# Patient Record
Sex: Female | Born: 1975 | Race: White | Hispanic: No | Marital: Single | Smoking: Current every day smoker
Health system: Southern US, Community
[De-identification: ages and names within clinical notes are randomized; demographics above are authoritative.]

## PROBLEM LIST (undated history)

## (undated) DIAGNOSIS — R87619 Unspecified abnormal cytological findings in specimens from cervix uteri: Secondary | ICD-10-CM

## (undated) DIAGNOSIS — O2230 Deep phlebothrombosis in pregnancy, unspecified trimester: Secondary | ICD-10-CM

## (undated) DIAGNOSIS — I2699 Other pulmonary embolism without acute cor pulmonale: Secondary | ICD-10-CM

## (undated) HISTORY — DX: Other pulmonary embolism without acute cor pulmonale: I26.99

## (undated) HISTORY — DX: Deep phlebothrombosis in pregnancy, unspecified trimester: O22.30

## (undated) HISTORY — PX: CHOLECYSTECTOMY: SHX55

## (undated) HISTORY — PX: KNEE SURGERY: SHX244

## (undated) HISTORY — PX: BUNIONECTOMY: SHX129

## (undated) HISTORY — DX: Unspecified abnormal cytological findings in specimens from cervix uteri: R87.619

## (undated) HISTORY — PX: NECK SURGERY: SHX720

---

## 2012-04-15 ENCOUNTER — Ambulatory Visit: Payer: Self-pay

## 2013-02-21 ENCOUNTER — Emergency Department: Payer: Self-pay | Admitting: Emergency Medicine

## 2013-02-21 DIAGNOSIS — S0990XA Unspecified injury of head, initial encounter: Secondary | ICD-10-CM | POA: Insufficient documentation

## 2013-02-21 DIAGNOSIS — S02401A Maxillary fracture, unspecified, initial encounter for closed fracture: Secondary | ICD-10-CM | POA: Insufficient documentation

## 2013-02-21 DIAGNOSIS — S0291XA Unspecified fracture of skull, initial encounter for closed fracture: Secondary | ICD-10-CM | POA: Insufficient documentation

## 2013-02-21 DIAGNOSIS — I2699 Other pulmonary embolism without acute cor pulmonale: Secondary | ICD-10-CM | POA: Insufficient documentation

## 2013-02-21 DIAGNOSIS — I609 Nontraumatic subarachnoid hemorrhage, unspecified: Secondary | ICD-10-CM | POA: Insufficient documentation

## 2013-02-21 DIAGNOSIS — Z8611 Personal history of tuberculosis: Secondary | ICD-10-CM | POA: Insufficient documentation

## 2013-02-21 LAB — ETHANOL: Ethanol %: 0.113 % — ABNORMAL HIGH (ref 0.000–0.080)

## 2013-02-21 LAB — CBC
HCT: 40 % (ref 35.0–47.0)
HGB: 13.2 g/dL (ref 12.0–16.0)
MCV: 89 fL (ref 80–100)
WBC: 19.4 10*3/uL — ABNORMAL HIGH (ref 3.6–11.0)

## 2013-02-21 LAB — COMPREHENSIVE METABOLIC PANEL
Albumin: 3.8 g/dL (ref 3.4–5.0)
Anion Gap: 7 (ref 7–16)
Chloride: 112 mmol/L — ABNORMAL HIGH (ref 98–107)
Creatinine: 0.71 mg/dL (ref 0.60–1.30)
EGFR (Non-African Amer.): >60
Osmolality: 279 (ref 275–301)
SGOT(AST): 26 U/L (ref 15–37)
Total Protein: 7.5 g/dL (ref 6.4–8.2)

## 2013-02-21 LAB — LIPASE, BLOOD: Lipase: 68 U/L — ABNORMAL LOW (ref 73–393)

## 2013-02-21 LAB — PROTIME-INR: INR: 1

## 2013-02-22 DIAGNOSIS — I82621 Acute embolism and thrombosis of deep veins of right upper extremity: Secondary | ICD-10-CM | POA: Insufficient documentation

## 2013-04-03 ENCOUNTER — Ambulatory Visit: Payer: Self-pay | Admitting: Hematology and Oncology

## 2013-07-14 ENCOUNTER — Emergency Department: Payer: Self-pay | Admitting: Emergency Medicine
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
## 2014-10-09 IMAGING — US US EXTREM UP VENOUS*R*
1 series · 13 of 24 positions shown · non-contrast
Comparison: None.

CLINICAL DATA: Right upper extremity swelling and pain, prior
history of DVT



[Series 1: us extrem up venous*right* · 0.06mm/px · 13 of 36 slices shown]
[im 1/36]
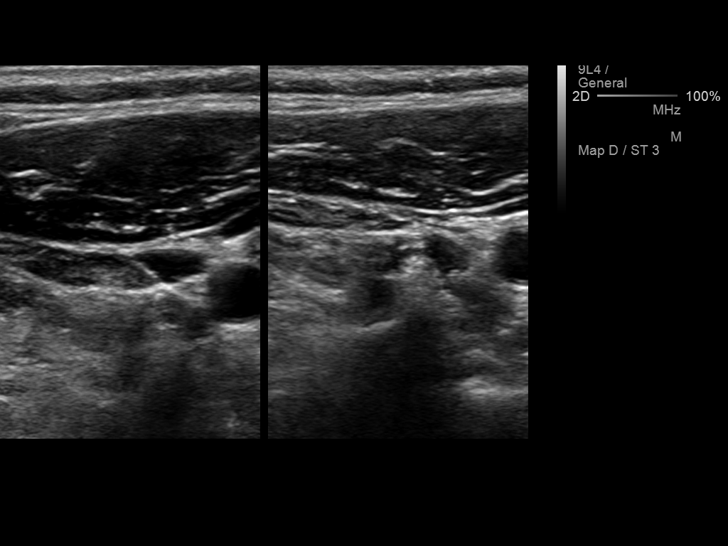
[im 4/36]
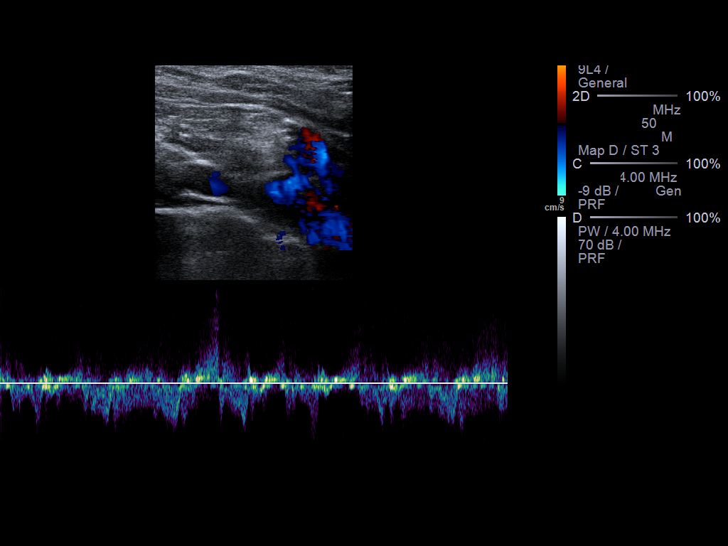
[im 7/36]
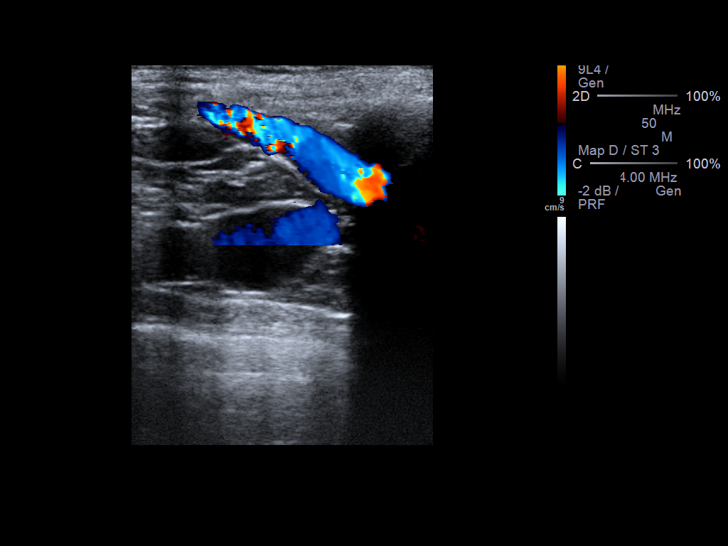
[im 10/36]
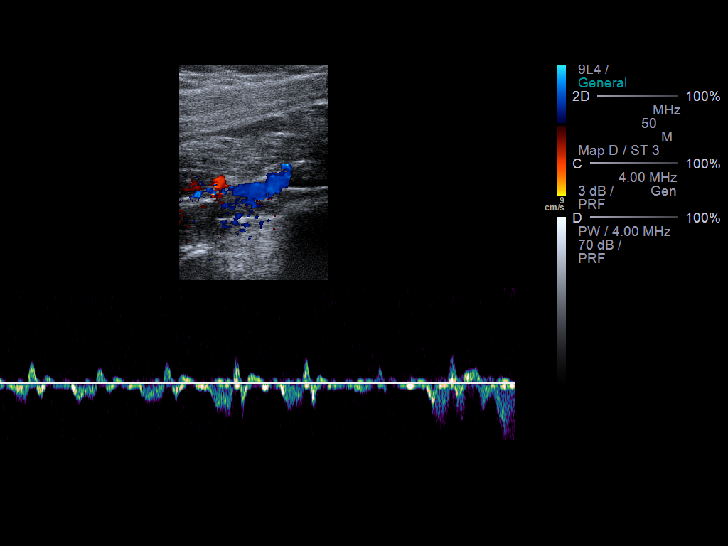
[im 13/36]
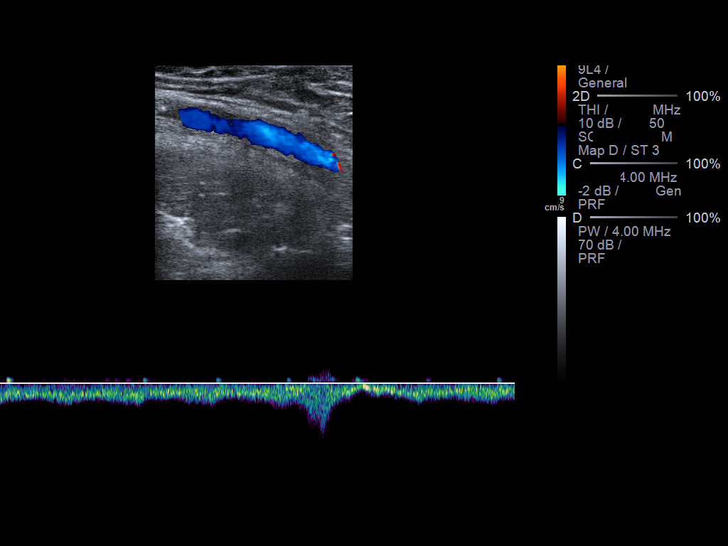
[im 16/36]
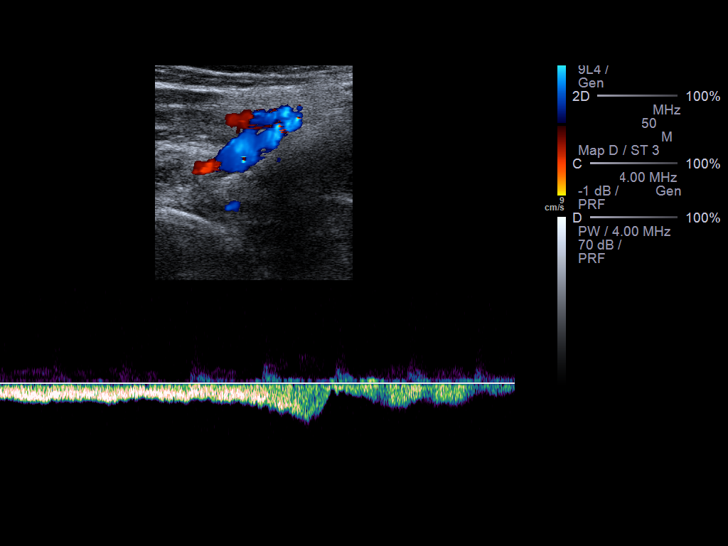
[im 19/36]
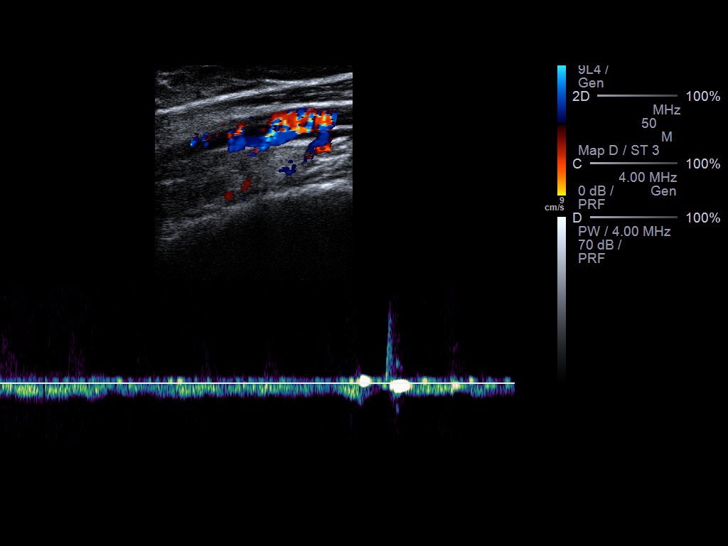
[im 20/36]
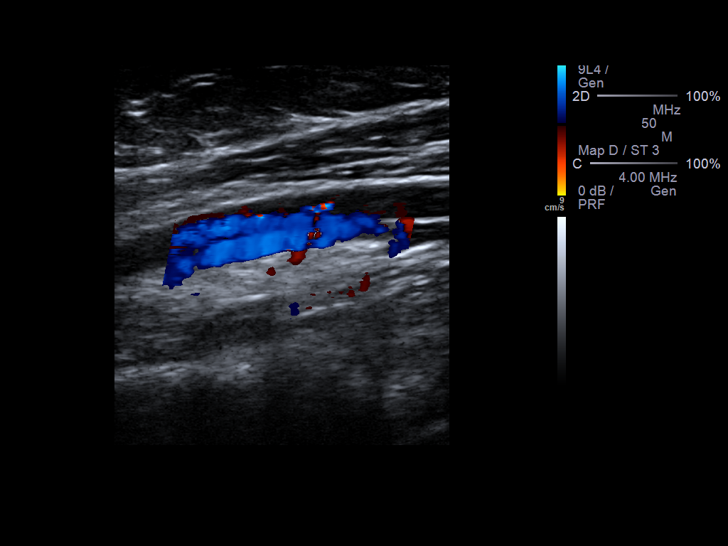
[im 23/36]
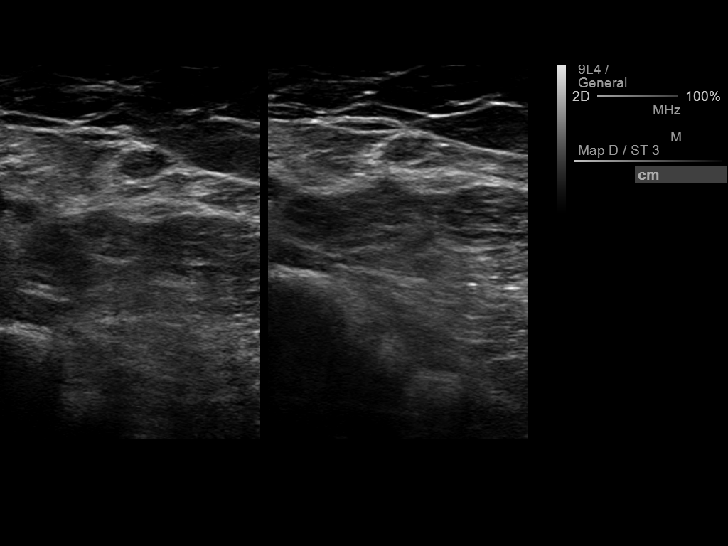
[im 26/36]
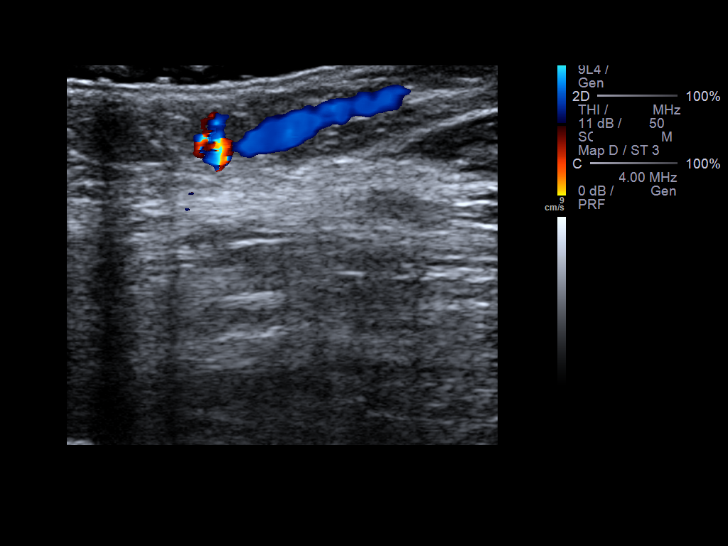
[im 29/36]
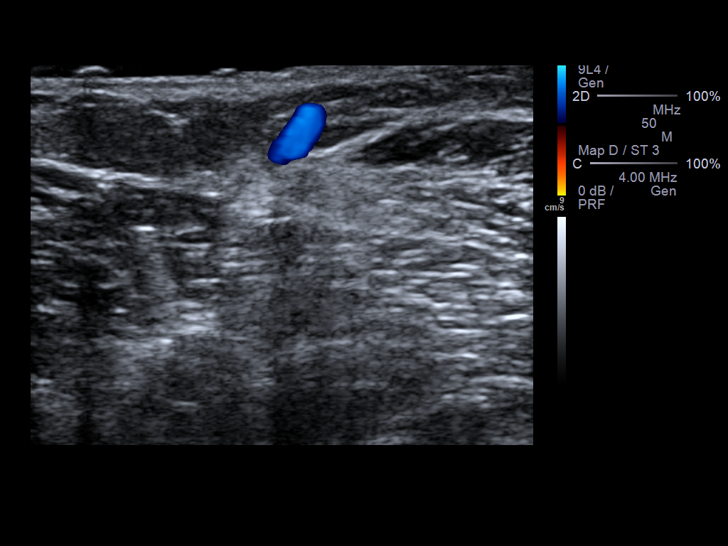
[im 32/36]
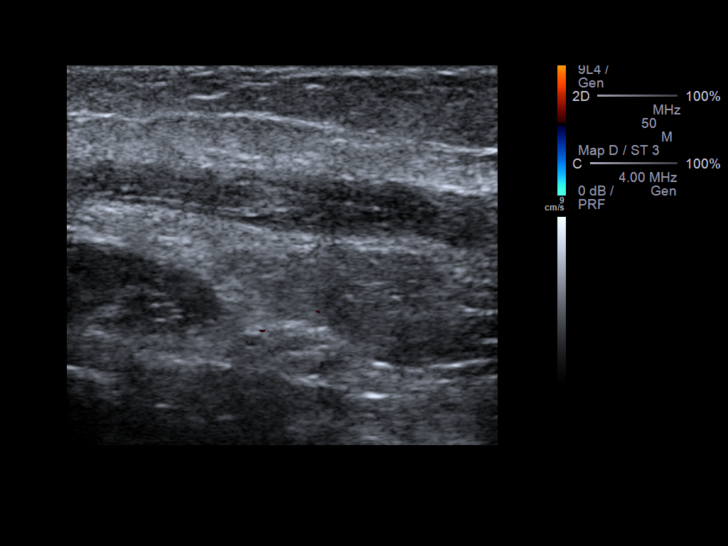
[im 36/36]
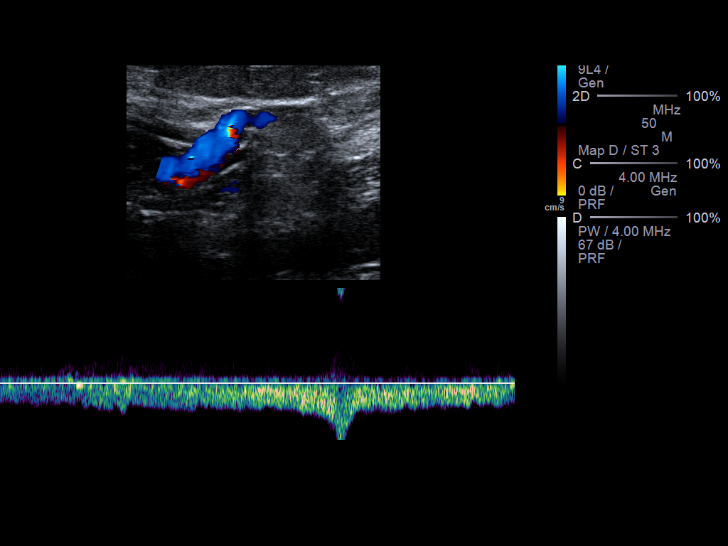

[13 of 24 positions shown; findings below may reference images not displayed]

FINDINGS: Internal Jugular Vein: No evidence of thrombus. Normal
compressibility, respiratory phasicity and response to augmentation.

Subclavian Vein: No evidence of thrombus. Normal compressibility,
respiratory phasicity and response to augmentation.

Axillary Vein: No evidence of thrombus. Normal compressibility,
respiratory phasicity and response to augmentation.

Cephalic Vein: No evidence of thrombus. Normal compressibility,
respiratory phasicity and response to augmentation.

Basilic Vein: Occlusive thrombus is identified in the right basilic
vein in the distal aspect of the upper extremity in the region of
the antecubital fossa. The vessel is noncompressible in this region.
The basilic vein is patent in the mid arm and more centrally.

Brachial Veins: No evidence of thrombus. Normal compressibility,
respiratory phasicity and response to augmentation.

Radial Veins: No evidence of thrombus. Normal compressibility,
respiratory phasicity and response to augmentation.

Ulnar Veins: No evidence of thrombus. Normal compressibility,
respiratory phasicity and response to augmentation.

Venous Reflux:  None visualized.

Other Findings:  None visualized.
IMPRESSION: Focal segment of acute occlusive DVT in the basilic vein in the
distal aspect of the upper arm in the region of the antecubital
fossa extending to just above the elbow.

These results were called by telephone at the time of interpretation
on 07/14/2013 at [DATE] to Dr. SIGFREDO TOURE , who verbally
acknowledged these results.

## 2016-08-07 ENCOUNTER — Encounter: Payer: Self-pay | Admitting: Emergency Medicine

## 2016-08-07 ENCOUNTER — Emergency Department: Payer: Self-pay

## 2016-08-07 ENCOUNTER — Emergency Department
Admission: EM | Admit: 2016-08-07 | Discharge: 2016-08-07 | Disposition: A | Discharge status: Home / Self Care | Payer: Self-pay | Attending: Emergency Medicine | Admitting: Emergency Medicine

## 2016-08-07 DIAGNOSIS — S81812A Laceration without foreign body, left lower leg, initial encounter: Secondary | ICD-10-CM | POA: Insufficient documentation

## 2016-08-07 DIAGNOSIS — Z23 Encounter for immunization: Secondary | ICD-10-CM | POA: Insufficient documentation

## 2016-08-07 DIAGNOSIS — W540XXA Bitten by dog, initial encounter: Secondary | ICD-10-CM | POA: Insufficient documentation

## 2016-08-07 DIAGNOSIS — Y999 Unspecified external cause status: Secondary | ICD-10-CM | POA: Insufficient documentation

## 2016-08-07 DIAGNOSIS — S81811A Laceration without foreign body, right lower leg, initial encounter: Secondary | ICD-10-CM | POA: Insufficient documentation

## 2016-08-07 DIAGNOSIS — Y92019 Unspecified place in single-family (private) house as the place of occurrence of the external cause: Secondary | ICD-10-CM | POA: Insufficient documentation

## 2016-08-07 DIAGNOSIS — Y939 Activity, unspecified: Secondary | ICD-10-CM | POA: Insufficient documentation

## 2016-08-07 DIAGNOSIS — F17210 Nicotine dependence, cigarettes, uncomplicated: Secondary | ICD-10-CM | POA: Insufficient documentation

## 2016-08-07 MED ORDER — OXYCODONE-ACETAMINOPHEN 5-325 MG PO TABS
1.0000 | ORAL_TABLET | Freq: Once | ORAL | Status: AC
  Administered 2016-08-07: 1 via ORAL
  Filled 2016-08-07: qty 1

## 2016-08-07 MED ORDER — AMOXICILLIN-POT CLAVULANATE 875-125 MG PO TABS: 1.0000 | ORAL_TABLET | Freq: Two times a day (BID) | ORAL | 0 refills | Status: AC

## 2016-08-07 MED ORDER — AMOXICILLIN-POT CLAVULANATE 875-125 MG PO TABS: 1.0000 | ORAL_TABLET | Freq: Two times a day (BID) | ORAL | 0 refills | Status: DC

## 2016-08-07 MED ORDER — TETANUS-DIPHTH-ACELL PERTUSSIS 5-2.5-18.5 LF-MCG/0.5 IM SUSP
0.5000 mL | Freq: Once | INTRAMUSCULAR | Status: AC
  Administered 2016-08-07: 0.5 mL via INTRAMUSCULAR
  Filled 2016-08-07: qty 0.5

## 2016-08-07 MED ORDER — OXYCODONE-ACETAMINOPHEN 5-325 MG PO TABS: 1.0000 | ORAL_TABLET | Freq: Four times a day (QID) | ORAL | 0 refills | Status: DC | PRN

## 2016-08-07 MED ORDER — LIDOCAINE HCL (PF) 1 % IJ SOLN
INTRAMUSCULAR | Status: AC
  Filled 2016-08-07: qty 10

## 2016-08-07 MED ORDER — OXYCODONE-ACETAMINOPHEN 5-325 MG PO TABS: 1.0000 | ORAL_TABLET | Freq: Four times a day (QID) | ORAL | 0 refills | Status: AC | PRN

## 2016-08-07 NOTE — ED Triage Notes (Addendum)
While at a potential client's home giving a quote on cleaning the house, patient was bitten by the dog on her leg.  [employer] speaking with patient in triage and patient refuses to give name and address of homeowner where bite occurred.  Patient does not know vaccination status of dog.    Dog is a pit bull.  Bite occurred last night at around 2300.  3 inch laceration to right lower leg.  Multiple puncture wounds to right lower leg.  Bleeding controlled.

## 2016-08-07 NOTE — ED Provider Notes (Signed)
[facility] Emergency Department Provider Note  ____________________________________________  Time seen: Approximately 8:18 AM  I have reviewed the triage vital signs and the nursing notes.   HISTORY  Chief Complaint Animal Bite    HPI Caitlin Wheeler is a 41 y.o. female that presents to the emergency department with dog bite. Patient was doing a quote for a house cleaning at a friends house 10 hours ago. Dog was on a chain but the chain was longer than the friend thought. Patient elevated leg and has been taking ibuprofen for pain. She did not realize how bad it was or she would have come in for evaluation last night. She has a large laceration on lower left leg and several puncture wounds and scratches to upper thigh. Last tetanus was in 2009. Dog is not up to date with shots. Patient denies fever, SOB, CP, nausea, vomiting, abdominal pain.     History reviewed. No pertinent past medical history.  There are no active problems to display for this patient.   History reviewed. No pertinent surgical history.  Prior to Admission medications   Medication Sig Start Date End Date Taking? Authorizing Provider  gabapentin (NEURONTIN) 600 MG tablet Take 1,800 mg by mouth 3 (three) times daily.   Yes [provider]  amoxicillin-clavulanate (AUGMENTIN) 875-125 MG tablet Take 1 tablet by mouth 2 (two) times daily. 08/07/16 08/17/16  Enid Derry, PA-C  oxyCODONE-acetaminophen (ROXICET) 5-325 MG tablet Take 1 tablet by mouth every 6 (six) hours as needed. 08/07/16 08/10/16  Enid Derry, PA-C    Allergies Patient has no known allergies.  No family history on file.  Social History Social History  Substance Use Topics  . Smoking status: Current Every Day Smoker    Types: E-cigarettes  . Smokeless tobacco: Never Used  . Alcohol use Yes     Review of Systems  Constitutional: No fever/chills Cardiovascular: No chest pain. Respiratory: No  SOB. Gastrointestinal: No abdominal pain.  No nausea, no vomiting.  Musculoskeletal: Positive for leg pain. Skin: Positive for laceration.  Neurological: Negative for headaches, numbness or tingling   ____________________________________________   PHYSICAL EXAM:  VITAL SIGNS: ED Triage Vitals [08/07/16 0726]  Enc Vitals Group     BP      Pulse      Resp      Temp      Temp src      SpO2      Weight 180 lb (81.6 kg)     Height 5\' 7"  (1.702 m)     Head Circumference      Peak Flow      Pain Score 10     Pain Loc      Pain Edu?      Excl. in GC?      Constitutional: Alert and oriented. Well appearing and in no acute distress. Eyes: Conjunctivae are normal. PERRL. EOMI. Head: Atraumatic. ENT:      Ears:      Nose: No congestion/rhinnorhea.      Mouth/Throat: Mucous membranes are moist.  Neck: No stridor.   Cardiovascular: Normal rate, regular rhythm.  Good peripheral circulation. Respiratory: Normal respiratory effort without tachypnea or retractions. Lungs CTAB. Good air entry to the bases with no decreased or absent breath sounds. Musculoskeletal: Full range of motion to all extremities. No gross deformities appreciated. Neurologic:  Normal speech and language. No gross focal neurologic deficits are appreciated.  Skin:  Skin is warm, dry. 2.5 in and 1 cm  lacerations to lower left shin. Multiple abrasions, punctures, and scratches to left thigh.   ____________________________________________   LABS (all labs ordered are listed, but only abnormal results are displayed)  Labs Reviewed - No data to display ____________________________________________  EKG   ____________________________________________  RADIOLOGY Lexine BatonI, Yared Susan, personally viewed and evaluated these images (plain radiographs) as part of my medical decision making, as well as reviewing the written report by the radiologist.  Dg Tibia/fibula Right  Result Date: 08/07/2016 CLINICAL DATA:  Dog  bite with a laceration. EXAM: RIGHT TIBIA AND FIBULA - 2 VIEW COMPARISON:  None. FINDINGS: The tibia and fibula are intact. No evidence for a fracture. Soft tissue lucency along the anterior mid and lower shin compatible with history of dog bite and laceration. No evidence for a radiopaque foreign body. IMPRESSION: No acute bone abnormality. Soft tissue changes compatible with a laceration. No evidence for a radiopaque foreign body. Electronically Signed   By: Richarda OverlieAdam  Henn M.D.   On: 08/07/2016 08:25    ____________________________________________    PROCEDURES  Procedure(s) performed:    Procedures  LACERATION REPAIR Performed by: Enid DerryAshley Lujean Ebright and Festus BarrenSelby PA-S  Consent: Verbal consent obtained.  Consent given by: patient  Prepped and Draped in normal sterile fashion  Wound explored: No foreign bodies   Laceration Location: right shin   Laceration Length: 2.5 inches  Anesthesia: None  Local anesthetic: lidocaine 1% without epinephrine  Anesthetic total: 8 ml  Irrigation method: syringe  Amount of cleaning: 500ml normal saline  Skin closure: 4-0 nylon  Number of sutures: 5  Technique: Horizontal mattress   Patient tolerance: Patient tolerated the procedure well with no immediate complications.  LACERATION REPAIR Performed by: Festus BarrenSelby PA-S  Consent: Verbal consent obtained.  Consent given by: patient  Prepped and Draped in normal sterile fashion  Wound explored: No foreign bodies   Laceration Location: left shin  Laceration Length: 1 cm  Anesthesia: None  Local anesthetic: lidocaine 1% without epinephrine  Anesthetic total: 3 ml  Irrigation method: syringe  Amount of cleaning: 500ml normal saline  Skin closure: 4-0 nylon  Number of sutures: 1  Technique: horizontal mattress   Patient tolerance: Patient tolerated the procedure well with no immediate complications.  Medications  Tdap (BOOSTRIX) injection 0.5 mL (0.5 mLs Intramuscular Given  08/07/16 0828)  oxyCODONE-acetaminophen (PERCOCET/ROXICET) 5-325 MG per tablet 1 tablet (1 tablet Oral Given 08/07/16 0828)     ____________________________________________   INITIAL IMPRESSION / ASSESSMENT AND PLAN / ED COURSE  Pertinent labs & imaging results that were available during my care of the patient were reviewed by me and considered in my medical decision making (see chart for details).  Review of the [place] CSRS was performed in accordance of the [facility] prior to dispensing any controlled drugs.   Patient's diagnosis is consistent with dog bite. Vital signs and exam are reassuring. Left tibia-fibula x-ray negative for radiopaque foreign object or acute bony abnormality. Wounds were irrigated with normal saline and iodine. Lacerations are gaping and under high tension. Horizontal mattress sutures were placed to better approximate wound. Laceration was loosely closed so the wound was not gaping but left loose to avoid infection. Tetanus shot was updated. Patient reported bite to police while in ED. Animal control was present and dog will be isolated for 10 days to be observed for rabies. Rabies vaccine will not be administered at this time and the patient will return to the ED if necessary. Patient will be discharged home with prescriptions for augmentin  and percocet. Patient is to follow up with PCP as directed. Patient is given ED precautions to return to the ED for any worsening or new symptoms.     ____________________________________________  FINAL CLINICAL IMPRESSION(S) / ED DIAGNOSES  Final diagnoses:  Dog bite, initial encounter      NEW MEDICATIONS STARTED DURING THIS VISIT:  Discharge Medication List as of 08/07/2016 10:38 AM          This chart was dictated using voice recognition software/Dragon. Despite best efforts to proofread, errors can occur which can change the meaning. Any change was purely unintentional.    Enid Derry, PA-C 08/07/16 1601     Emily Filbert, MD 08/08/16 [phone]

## 2016-08-07 NOTE — ED Notes (Signed)
Wound was cleaned with saline/betadine solution.

## 2016-08-07 NOTE — ED Notes (Signed)
See triage note  States she was bitten by a pit bull last pm around 10 pm    Laceration noted to right lower leg pt is very tearful  Family at bedside

## 2016-08-19 ENCOUNTER — Emergency Department
Admission: EM | Admit: 2016-08-19 | Discharge: 2016-08-19 | Disposition: A | Discharge status: Home / Self Care | Payer: Self-pay | Attending: Dermatology | Admitting: Dermatology

## 2016-08-19 ENCOUNTER — Encounter: Payer: Self-pay | Admitting: Emergency Medicine

## 2016-08-19 DIAGNOSIS — Z4802 Encounter for removal of sutures: Secondary | ICD-10-CM | POA: Insufficient documentation

## 2016-08-19 DIAGNOSIS — F17290 Nicotine dependence, other tobacco product, uncomplicated: Secondary | ICD-10-CM | POA: Insufficient documentation

## 2016-08-19 DIAGNOSIS — Z4800 Encounter for change or removal of nonsurgical wound dressing: Secondary | ICD-10-CM | POA: Insufficient documentation

## 2016-08-19 DIAGNOSIS — Z79899 Other long term (current) drug therapy: Secondary | ICD-10-CM | POA: Insufficient documentation

## 2016-08-19 DIAGNOSIS — Z5189 Encounter for other specified aftercare: Secondary | ICD-10-CM

## 2016-08-19 MED ORDER — CEPHALEXIN 500 MG PO CAPS: 500.0000 mg | ORAL_CAPSULE | Freq: Three times a day (TID) | ORAL | 0 refills | Status: AC

## 2016-08-19 NOTE — ED Notes (Signed)
Pt bit by a pitbull 10 days ago on left lower leg. Here for suture removal. Denies any pain currently.  Erythematous around wound bed.

## 2016-08-19 NOTE — ED Provider Notes (Signed)
[facility] Emergency Department Provider Note  ____________________________________________  Time seen: Approximately 3:33 PM  I have reviewed the triage vital signs and the nursing notes.   HISTORY  Chief Complaint Suture / Staple Removal  HPI Caitlin Wheeler is a 41 y.o. female who presents to the emergency department for suture removal and wound check. Sutures were inserted here on 08/08/2016 after she had a large laceration secondary to dog bite. Patient states that she completed the initial 10 days of antibiotics, but there has continued to be pain and erythema over the site. She denies fever.  History reviewed. No pertinent past medical history.  There are no active problems to display for this patient.   History reviewed. No pertinent surgical history.  Prior to Admission medications   Medication Sig Start Date End Date Taking? Authorizing Provider  cephALEXin (KEFLEX) 500 MG capsule Take 1 capsule (500 mg total) by mouth 3 (three) times daily. 08/19/16 08/26/16  Ilanna Deihl, Rulon Eisenmenger B, FNP  gabapentin (NEURONTIN) 600 MG tablet Take 1,800 mg by mouth 3 (three) times daily.    [provider]    Allergies Patient has no known allergies.  No family history on file.  Social History Social History  Substance Use Topics  . Smoking status: Current Every Day Smoker    Types: E-cigarettes  . Smokeless tobacco: Never Used  . Alcohol use Yes    Review of Systems Constitutional: No fever/chills Eyes: No visual changes. ENT: No sore throat. Cardiovascular: Denies chest pain. Respiratory: Denies shortness of breath. Gastrointestinal: No abdominal pain.  No nausea, no vomiting.  Musculoskeletal: Negative for back pain. Skin: Positive for wounds on the left lower extremity. Neurological: Negative for headaches, focal weakness or numbness. ____________________________________________   PHYSICAL EXAM:  VITAL SIGNS: ED Triage Vitals  Enc  Vitals Group     BP 08/19/16 1106 130/78     Pulse Rate 08/19/16 1106 89     Resp 08/19/16 1106 16     Temp 08/19/16 1106 98.4 F (36.9 C)     Temp Source 08/19/16 1106 Oral     SpO2 08/19/16 1106 97 %     Weight --      Height --      Head Circumference --      Peak Flow --      Pain Score 08/19/16 1116 0     Pain Loc --      Pain Edu? --      Excl. in GC? --     Constitutional: Alert and oriented. Well appearing and in no acute distress. Eyes: Conjunctivae are normal. EOMI. Nose: No congestion/rhinnorhea. Mouth/Throat: Mucous membranes are moist.  Cardiovascular:Good peripheral circulation. Respiratory: Normal respiratory effort.  No retractions.  Musculoskeletal: Full ROM throughout. Neurologic:  Normal speech and language. No gross focal neurologic deficits are appreciated. Speech is normal. No gait instability. Skin: Sutured lacerations on the left lower extremity with surrounding erythema. No drainage noted from the wound. Psychiatric: Mood and affect are normal. Speech and behavior are normal.  ____________________________________________   LABS (all labs ordered are listed, but only abnormal results are displayed)  Labs Reviewed - No data to display ____________________________________________  RADIOLOGY  Not indicated ____________________________________________   PROCEDURES  Procedure(s) performed: Sutures removed from LLE by RN  ___________________________________________   INITIAL IMPRESSION / ASSESSMENT AND PLAN / ED COURSE  41 year old female presenting to the ER for suture removal and evaluation of wound. Sutures removed today. She states she finished  the Augmentin as prescribed, but some concern remains for localized infection. She will be treated with Keflex and advised to follow up with the primary care provider in a couple of days for a wound check. She is to return to the ER for symptoms that change or worsen if unable to schedule an  appointment.  Pertinent labs & imaging results that were available during my care of the patient were reviewed by me and considered in my medical decision making (see chart for details).  Patient was advised to follow up with the primary care provider for symptoms of concern or return to the emergency department if unable to schedule an appointment. ____________________________________________   FINAL CLINICAL IMPRESSION(S) / ED DIAGNOSES  Final diagnoses:  Visit for suture removal  Visit for wound check    Note:  This document was prepared using Dragon voice recognition software and may include unintentional dictation errors.     Chinita Pesterriplett, Fronnie Urton B, FNP 08/23/16 [phone]    Jeanmarie PlantMcShane, James A, MD 08/24/16 [phone]

## 2016-08-19 NOTE — Discharge Instructions (Signed)
Follow up with your primary care provider if redness isn't going away over the next 2-3 days or sooner if it gets worse. Return to the ER for symptoms that change or worsen if unable to schedule an appointment.

## 2016-08-19 NOTE — ED Triage Notes (Signed)
Pt seen 10 days ago for dog bite, needs sutures removed.

## 2016-09-06 ENCOUNTER — Encounter: Payer: Self-pay | Admitting: Advanced Practice Midwife

## 2016-09-07 ENCOUNTER — Encounter: Payer: Self-pay | Admitting: Advanced Practice Midwife

## 2016-09-07 ENCOUNTER — Ambulatory Visit (INDEPENDENT_AMBULATORY_CARE_PROVIDER_SITE_OTHER): Payer: Self-pay | Admitting: Advanced Practice Midwife

## 2016-09-07 VITALS — BP 112/70 | HR 100 | Ht 67.0 in | Wt 192.0 lb

## 2016-09-07 DIAGNOSIS — S199XXA Unspecified injury of neck, initial encounter: Secondary | ICD-10-CM | POA: Insufficient documentation

## 2016-09-07 DIAGNOSIS — Z30011 Encounter for initial prescription of contraceptive pills: Secondary | ICD-10-CM

## 2016-09-07 DIAGNOSIS — F1010 Alcohol abuse, uncomplicated: Secondary | ICD-10-CM | POA: Insufficient documentation

## 2016-09-07 DIAGNOSIS — Z3046 Encounter for surveillance of implantable subdermal contraceptive: Secondary | ICD-10-CM

## 2016-09-07 DIAGNOSIS — L409 Psoriasis, unspecified: Secondary | ICD-10-CM | POA: Insufficient documentation

## 2016-09-07 DIAGNOSIS — F1490 Cocaine use, unspecified, uncomplicated: Secondary | ICD-10-CM | POA: Insufficient documentation

## 2016-09-07 MED ORDER — NORETHINDRONE 0.35 MG PO TABS: 1.0000 | ORAL_TABLET | Freq: Every day | ORAL | 11 refills | Status: DC

## 2016-09-07 NOTE — Progress Notes (Signed)
     GYNECOLOGY PROCEDURE NOTE Patient is here for removal of Nexplanon which was inserted 3 years ago. She has complaints of heavy, prolonged and irregular bleeding during the 3 years. Patient is requesting the pill for contraception. She uses e cigarettes and occasional cigarettes. She is advised to use progesterone only contraception due to increased risk from blood clots, stroke while using tobacco.   Nexplanon removal discussed in detail.  Risks of infection, bleeding, nerve injury all reviewed.  Patient understands risks and desires to proceed.  Verbal consent obtained.  Patient is certain she wants the Nexplanon removed.  All questions answered.   Procedure: Patient placed in dorsal supine with left arm above head, elbow flexed at 90 degrees, arm resting on examination table.  Nexplanon identified without problems.  Betadine scrub x3.  1 ml of 1% lidocaine injected under Nexplanon device without problems.  Sterile gloves applied.  Small 0.5cm incision made at distal tip of Nexplanon device with 11 blade scalpel.  Nexplanon brought to incision and grasped with a small kelly clamp.  Nexplanon removed intact without problems.  Pressure applied to incision.  Hemostasis obtained.  Steri-strips applied, followed by bandage and compression dressing.  Patient tolerated procedure well.  No complications.   Assessment: 41 y.o. year old female now s/p uncomplicated Nexplanon removal.  Plan: 1.  Patient given post procedure precautions and asked to call for fever, chills, redness or drainage from her incision, bleeding from incision.  She understands she will likely have a small bruise near site of removal and can remove bandage tomorrow and steri-strips in approximately 1 week.  2) Contraception: progesterone only pill  3) Schedule annual exam   Caitlin Wheeler, CNM

## 2016-09-10 ENCOUNTER — Encounter: Payer: Self-pay | Admitting: Emergency Medicine

## 2016-09-10 ENCOUNTER — Emergency Department
Admission: EM | Admit: 2016-09-10 | Discharge: 2016-09-10 | Disposition: A | Discharge status: Home / Self Care | Payer: Self-pay | Attending: Student in an Organized Health Care Education/Training Program | Admitting: Student in an Organized Health Care Education/Training Program

## 2016-09-10 DIAGNOSIS — T814XXD Infection following a procedure, subsequent encounter: Secondary | ICD-10-CM | POA: Insufficient documentation

## 2016-09-10 DIAGNOSIS — Z5189 Encounter for other specified aftercare: Secondary | ICD-10-CM

## 2016-09-10 DIAGNOSIS — Y69 Unspecified misadventure during surgical and medical care: Secondary | ICD-10-CM | POA: Insufficient documentation

## 2016-09-10 DIAGNOSIS — F17290 Nicotine dependence, other tobacco product, uncomplicated: Secondary | ICD-10-CM | POA: Insufficient documentation

## 2016-09-10 DIAGNOSIS — W540XXD Bitten by dog, subsequent encounter: Secondary | ICD-10-CM | POA: Insufficient documentation

## 2016-09-10 DIAGNOSIS — Z79899 Other long term (current) drug therapy: Secondary | ICD-10-CM | POA: Insufficient documentation

## 2016-09-10 DIAGNOSIS — L089 Local infection of the skin and subcutaneous tissue, unspecified: Secondary | ICD-10-CM

## 2016-09-10 DIAGNOSIS — Z9049 Acquired absence of other specified parts of digestive tract: Secondary | ICD-10-CM | POA: Insufficient documentation

## 2016-09-10 DIAGNOSIS — S81832D Puncture wound without foreign body, left lower leg, subsequent encounter: Secondary | ICD-10-CM | POA: Insufficient documentation

## 2016-09-10 DIAGNOSIS — T148XXA Other injury of unspecified body region, initial encounter: Secondary | ICD-10-CM

## 2016-09-10 MED ORDER — CLINDAMYCIN HCL 300 MG PO CAPS: 300.0000 mg | ORAL_CAPSULE | Freq: Four times a day (QID) | ORAL | 0 refills | Status: AC

## 2016-09-10 MED ORDER — CLINDAMYCIN PHOSPHATE 600 MG/50ML IV SOLN
600.0000 mg | Freq: Once | INTRAVENOUS | Status: AC
  Administered 2016-09-10: 600 mg via INTRAVENOUS
  Filled 2016-09-10: qty 50

## 2016-09-10 MED ORDER — SODIUM CHLORIDE 0.9 % IV BOLUS (SEPSIS)
1000.0000 mL | Freq: Once | INTRAVENOUS | Status: AC
  Administered 2016-09-10: 1000 mL via INTRAVENOUS

## 2016-09-10 NOTE — ED Notes (Signed)
Pt here for wound check post may 20th dog bite.  Pt states took abx as prescribed.  Reports took off bandage Friday and states area red, and swollen.

## 2016-09-10 NOTE — ED Provider Notes (Signed)
[facility] Emergency Department Provider Note   ____________________________________________   I have reviewed the triage vital signs and the nursing notes.   HISTORY  Chief Complaint Wound Check    HPI Caitlin Wheeler is a 41 y.o. female presents with left anterior lower leg wound that has not resolved since sustaining a dog bite on May 20. The wound initially was sutured and patient received Augmentin which she completed that course of antibiotic. When she returned for suture removal, the wound appeared erythematous and irritated with likely infection. Patient was prescribed a course of cephalexin. Patient completed this course of antibiotics and noted improvement of the wound overall. However, over the last week patient noted the wound where the dog's tooth had punctured began to drain with purulent drainage, the periwound area became erythematous and the entire lower leg became swollen. The patient decided to return to the emergency department for wound check.   Patient denies fever, chills, headache, vision changes, chest pain, chest tightness, shortness of breath, abdominal pain, nausea and vomiting.  Past Medical History:  Diagnosis Date  . Abnormal Pap smear of cervix   . DVT (deep vein thrombosis) in pregnancy ([place])   . Pulmonary embolism [facility])     Patient Active Problem List   Diagnosis Date Noted  . Alcohol abuse 09/07/2016  . Crack cocaine use 09/07/2016  . Neck injury 09/07/2016  . Psoriasis 09/07/2016  . Deep venous thrombosis of right upper extremity ([place]) 02/22/2013  . Closed skull fracture ([place]) 02/21/2013  . Head injury, acute 02/21/2013  . 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 Maxillary sinus fracture ([place]) 02/21/2013  . Pulmonary embolism ([place]) 02/21/2013  . Subarachnoid hemorrhage ([place]) 02/21/2013    Past Surgical History:  Procedure Laterality Date  . CHOLECYSTECTOMY    . KNEE SURGERY    . NECK SURGERY       Prior to Admission medications   Medication Sig Start Date End Date Taking? Authorizing Provider  clindamycin (CLEOCIN) 300 MG capsule Take 1 capsule (300 mg total) by mouth 4 (four) times daily. 09/10/16 09/20/16  Kellis Mcadam M, PA-C  Diclofenac Sodium (DICLO GEL) 1 % KIT Place onto the skin.    [provider]  gabapentin (NEURONTIN) 300 MG capsule Take 900 mg by mouth. 03/12/13   [provider]  gabapentin (NEURONTIN) 600 MG tablet Take 1,800 mg by mouth 3 (three) times daily.    [provider]  norethindrone (MICRONOR,CAMILA,ERRIN) 0.35 MG tablet Take 1 tablet (0.35 mg total) by mouth daily. 09/07/16   Rod Can, CNM    Allergies Latex  Family History  Problem Relation Age of Onset  . Hypertension Mother     Social History Social History  Substance Use Topics  . Smoking status: Current Every Day Smoker    Types: E-cigarettes  . Smokeless tobacco: Never Used  . Alcohol use Yes    Review of Systems Constitutional: Negative for fever/chills Eyes: No visual changes. ENT:  Negative for sore throat and for difficulty swallowing Cardiovascular: Denies chest pain. Respiratory: Denies cough Denies shortness of breath. Gastrointestinal: No abdominal pain.  No nausea, vomiting, diarrhea. Musculoskeletal: Negative for back pain. Negative for generalized body aches. Skin: Negative for rash. Erythema, induration and pain along left anterior lower leg approximately where prior dog bite occurred. Neurological: Negative for headaches. Able to ambulate. ____________________________________________   PHYSICAL EXAM:  VITAL SIGNS: ED Triage Vitals  Enc Vitals Group     BP 09/10/16 1145 (!) 132/92  Pulse Rate 09/10/16 1145 (!) 105     Resp 09/10/16 1145 18     Temp 09/10/16 1145 98.1 F (36.7 C)     Temp Source 09/10/16 1145 Oral     SpO2 09/10/16 1145 98 %     Weight 09/10/16 1146 192 lb (87.1 kg)     Height 09/10/16 1146 5' 7"  (1.702 m)      Head Circumference --      Peak Flow --      Pain Score 09/10/16 1145 3     Pain Loc --      Pain Edu? --      Excl. in [place]? --     Constitutional: Alert and oriented. Well appearing and in no acute distress.  Head: Normocephalic and atraumatic. Eyes: Conjunctivae are normal. PERRL.  Cardiovascular: Normal rate, regular rhythm. Normal distal pulses. Respiratory: Normal respiratory effort. Musculoskeletal: Nontender with normal range of motion in all extremities. Neurologic: Normal speech and language. No gross focal neurologic deficits are appreciated. No gait instability. Skin:  Skin is warm, dry and intact. No rash noted. Prior sutured wound healing adequately. Puncture wound covered with scabbing and minimal purulent drainage. Periwound is indurated, erythematous and very tender to palpation. Indurated area is approximately 6.5 cm in diameter with erythema approximately 8 cm in diameter. No fluctuant noted. Psychiatric: Mood and affect are normal.  ____________________________________________   LABS (all labs ordered are listed, but only abnormal results are displayed)  Labs Reviewed - No data to display ____________________________________________  EKG None ____________________________________________  RADIOLOGY None ____________________________________________   PROCEDURES  Procedure(s) performed:  Point-of-care ultrasound unremarkable for abscess or fluctuant.    Critical Care performed: no ____________________________________________   INITIAL IMPRESSION / ASSESSMENT AND PLAN / ED COURSE  Pertinent labs & imaging results that were available during my care of the patient were reviewed by me and considered in my medical decision making (see chart for details).  Patient presents with left anterior lower leg wound she originally sustained from a dog bite in May. Patient completed 2 courses of antibiotics, Augmentin and cephalexin. Patient returned today for a  wound recheck secondary to development of infection symptoms to include erythema, induration and purulent drainage from the puncture wound. Patient has been afebrile through the healing period of original injury and she is afebrile today. Point-of-care ultrasound was unremarkable for an abscess at this time. Patient received IV dose of clindamycin 600 mg during the course of care in emergency department today and she will be prescribed clindamycin to continue once discharged. Advised patient to keep the wound area covered during her work day and when out in the community. Also advised her to continue monitoring for worsening signs of infection and instruct her return to the emergency department if wound area did not improve. Patient informed of clinical course, understand medical decision-making process, and agree with plan.  Patient was advised to follow up with PCP as needed and was also advised to return to the emergency department for symptoms that change or worsen.      ____________________________________________   FINAL CLINICAL IMPRESSION(S) / ED DIAGNOSES  Final diagnoses:  Visit for wound check  Wound infection       NEW MEDICATIONS STARTED DURING THIS VISIT:  Discharge Medication List as of 09/10/2016  3:43 PM    START taking these medications   Details  clindamycin (CLEOCIN) 300 MG capsule Take 1 capsule (300 mg total) by mouth 4 (four) times daily., Starting Mon 09/10/2016, Until Thu  09/20/2016, Print         Note:  This document was prepared using Dragon voice recognition software and may include unintentional dictation errors.    Jerolyn Shin, PA-C 09/10/16 1656    Merlyn Lot, MD 09/11/16 [phone]

## 2016-09-10 NOTE — ED Triage Notes (Signed)
Pt reports has been seen here for wound to lower left leg and treated with antibiotics. Pt reports has been cleaning site with hydrogen peroxide and wound has been bubbling. Pt reports hitting the wound while at work the other day and the redness has increased and the area feels tight. Pt ambulatory to triage. No drainage noted from wound. Pt afebrile.

## 2017-11-02 IMAGING — DX DG TIBIA/FIBULA 2V*R*
2 series · 2 of 2 positions shown · non-contrast
Comparison: None.

CLINICAL DATA: Dog bite with a laceration.

EXAM:
RIGHT TIBIA AND FIBULA - 2 VIEW

[tibia ap]
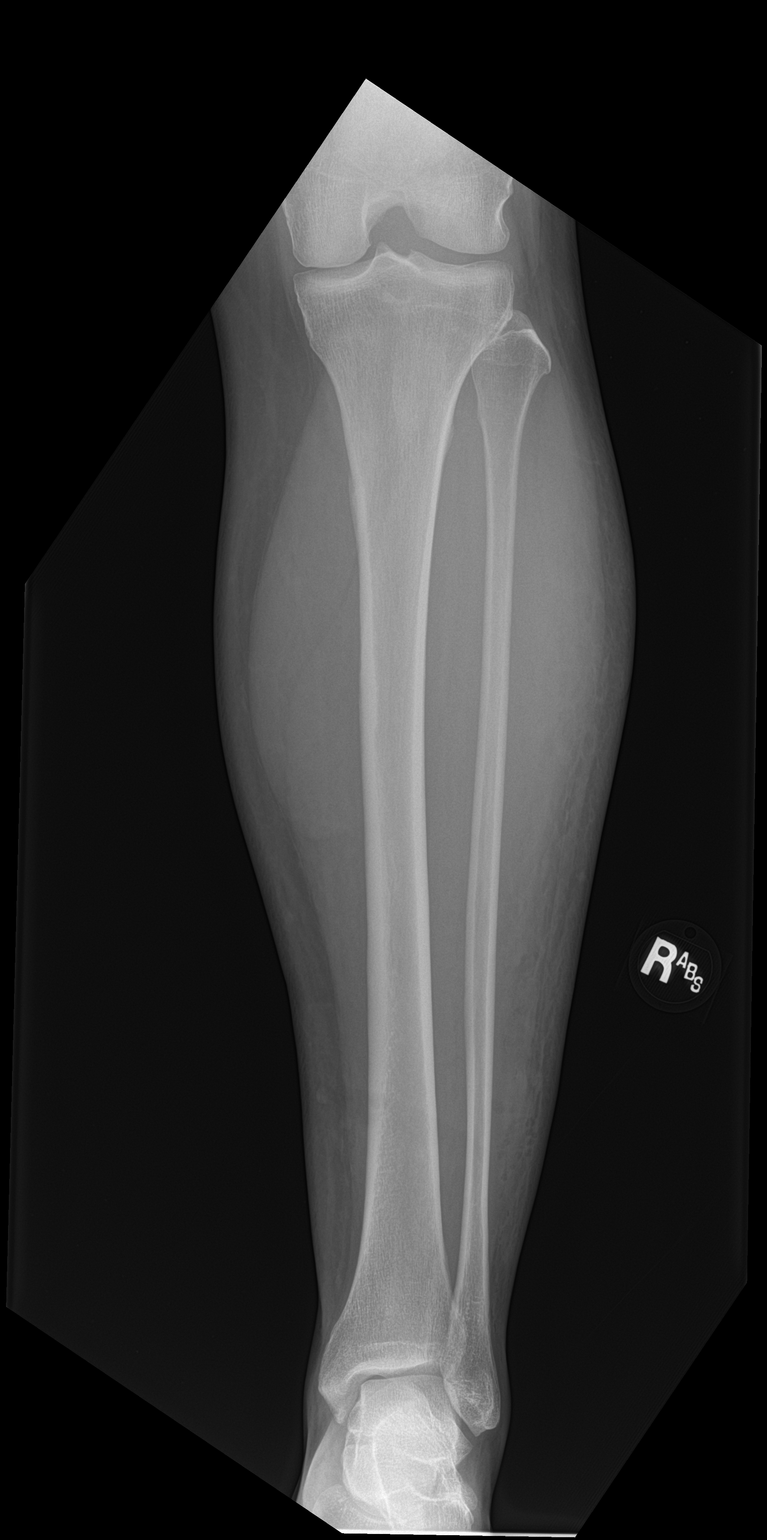

[tibia lat]
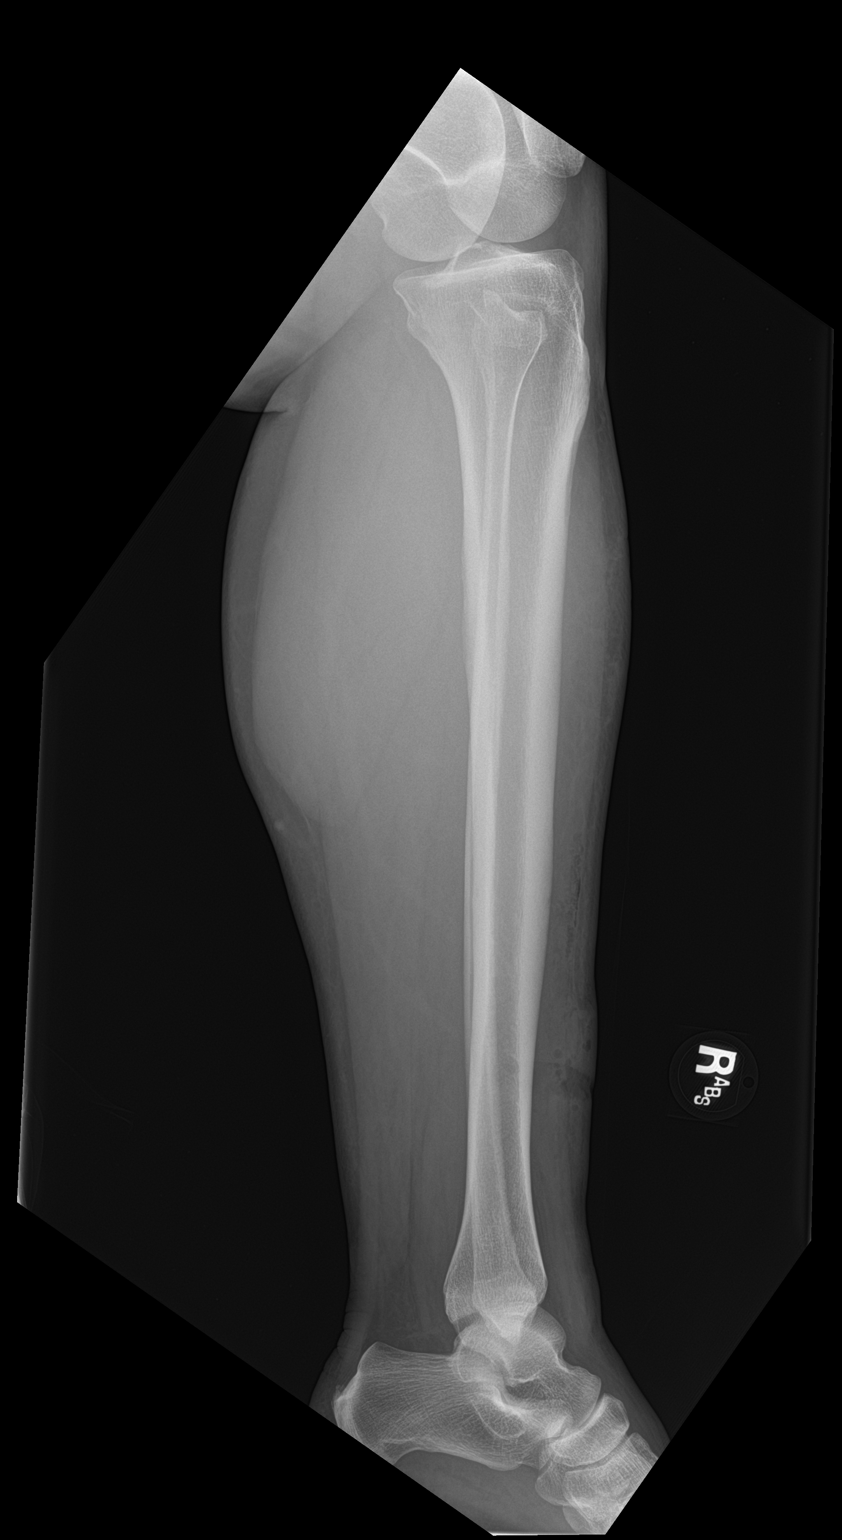

[2 of 2 positions shown; findings below may reference images not displayed]

FINDINGS: The tibia and fibula are intact. No evidence for a fracture. Soft
tissue lucency along the anterior mid and lower shin compatible with
history of dog bite and laceration. No evidence for a radiopaque
foreign body.
IMPRESSION: No acute bone abnormality.

Soft tissue changes compatible with a laceration. No evidence for a
radiopaque foreign body.

## 2017-12-19 ENCOUNTER — Other Ambulatory Visit: Payer: Self-pay | Admitting: Advanced Practice Midwife

## 2017-12-19 DIAGNOSIS — Z30011 Encounter for initial prescription of contraceptive pills: Secondary | ICD-10-CM

## 2018-01-16 DIAGNOSIS — E663 Overweight: Secondary | ICD-10-CM | POA: Insufficient documentation

## 2018-01-16 DIAGNOSIS — F17200 Nicotine dependence, unspecified, uncomplicated: Secondary | ICD-10-CM | POA: Insufficient documentation

## 2018-01-16 LAB — HM PAP SMEAR: HM Pap smear: NEGATIVE

## 2018-01-16 LAB — HM HIV SCREENING LAB: HM HIV Screening: NEGATIVE

## 2018-07-23 DIAGNOSIS — F17200 Nicotine dependence, unspecified, uncomplicated: Secondary | ICD-10-CM

## 2018-07-23 DIAGNOSIS — E663 Overweight: Secondary | ICD-10-CM

## 2019-08-05 ENCOUNTER — Ambulatory Visit: Payer: Self-pay

## 2020-09-15 ENCOUNTER — Ambulatory Visit: Payer: Self-pay

## 2020-09-21 ENCOUNTER — Encounter: Payer: Self-pay | Admitting: Family Medicine

## 2020-09-21 ENCOUNTER — Other Ambulatory Visit: Payer: Self-pay

## 2020-09-21 ENCOUNTER — Ambulatory Visit: Payer: Self-pay | Admitting: Family Medicine

## 2020-09-21 DIAGNOSIS — F1911 Other psychoactive substance abuse, in remission: Secondary | ICD-10-CM

## 2020-09-21 DIAGNOSIS — Z113 Encounter for screening for infections with a predominantly sexual mode of transmission: Secondary | ICD-10-CM

## 2020-09-21 DIAGNOSIS — Z91410 Personal history of adult physical and sexual abuse: Secondary | ICD-10-CM

## 2020-09-21 LAB — WET PREP FOR TRICH, YEAST, CLUE
Trichomonas Exam: NEGATIVE
Yeast Exam: NEGATIVE

## 2020-09-21 NOTE — Progress Notes (Signed)
Wet mount reviewed by provider, no tx per provider order. Pt accepted [employer] brochure. Provider orders completed.
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
## 2020-09-28 LAB — GONOCOCCUS CULTURE

## 2020-10-18 ENCOUNTER — Ambulatory Visit (LOCAL_COMMUNITY_HEALTH_CENTER): Payer: Self-pay

## 2020-10-18 DIAGNOSIS — Z111 Encounter for screening for respiratory tuberculosis: Secondary | ICD-10-CM

## 2020-10-18 NOTE — Progress Notes (Signed)
No charge PPD. 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  Needs CXR for admission into a treatment facility.  Informed patient we will place PPD and if positive on Friday, [facility] will proceed with ordering CXR.  Reports taking INH x 9 months. TB RN completed EPI at PPD placement appt.  Will only need a weight, CXR and offer HIV/RPR at PPDR appt if positive.  Patient referred to TB RN by [facility].  Patient also states that she needs an appt to assess nerve damage in her neck, back and arm.  Reports tingling/numbenss in fingers on right hand.  Informed that [facility] doesn't do Primary Care.  Will need an appt at [facility] or be seen at an Urgent care. Richmond Campbell, RN

## 2020-10-21 ENCOUNTER — Other Ambulatory Visit: Payer: Self-pay

## 2021-06-09 ENCOUNTER — Ambulatory Visit (LOCAL_COMMUNITY_HEALTH_CENTER): Payer: Self-pay | Admitting: Nurse Practitioner

## 2021-06-09 ENCOUNTER — Encounter: Payer: Self-pay | Admitting: Nurse Practitioner

## 2021-06-09 ENCOUNTER — Other Ambulatory Visit: Payer: Self-pay

## 2021-06-09 ENCOUNTER — Ambulatory Visit: Payer: Self-pay | Admitting: Nurse Practitioner

## 2021-06-09 VITALS — BP 132/88 | Ht 67.0 in | Wt 195.6 lb

## 2021-06-09 DIAGNOSIS — Z3009 Encounter for other general counseling and advice on contraception: Secondary | ICD-10-CM

## 2021-06-09 DIAGNOSIS — Z3041 Encounter for surveillance of contraceptive pills: Secondary | ICD-10-CM

## 2021-06-09 DIAGNOSIS — B379 Candidiasis, unspecified: Secondary | ICD-10-CM

## 2021-06-09 DIAGNOSIS — Z113 Encounter for screening for infections with a predominantly sexual mode of transmission: Secondary | ICD-10-CM

## 2021-06-09 LAB — WET PREP FOR TRICH, YEAST, CLUE: Trichomonas Exam: NEGATIVE

## 2021-06-09 MED ORDER — NORETHINDRONE 0.35 MG PO TABS: 1.0000 | ORAL_TABLET | Freq: Every day | ORAL | 0 refills | Status: DC

## 2021-06-09 MED ORDER — CLOTRIMAZOLE 1 % VA CREA: 1.0000 | TOPICAL_CREAM | Freq: Every day | VAGINAL | 0 refills | Status: AC

## 2021-06-09 NOTE — Progress Notes (Signed)
? ?[place] problem visit  ?[place] Department ? ?Subjective:  ?Caitlin Wheeler is a 46 y.o. being seen today for STD screening and a quick start for birth control pills.  ? ?Chief Complaint  ?Patient presents with  ? Contraception  ? ? ?HPI ? ? ?Does the patient have a current or past history of drug use? Yes, see STD note. ? No components found for: HCV] ? ? ?Health Maintenance Due  ?Topic Date Due  ? COVID-19 Vaccine (1) Never done  ? Hepatitis C Screening  Never done  ? INFLUENZA VACCINE  Never done  ? COLONOSCOPY (Pts 45-37yrs Insurance coverage will need to be confirmed)  Never done  ? PAP SMEAR-Modifier  01/16/2021  ? ? ?Review of Systems  ?Constitutional:  Negative for chills, fever, malaise/fatigue and weight loss.  ?HENT:  Negative for congestion, hearing loss and sore throat.   ?Eyes:  Negative for blurred vision, double vision and photophobia.  ?Respiratory:  Negative for shortness of breath.   ?Cardiovascular:  Negative for chest pain.  ?Gastrointestinal:  Negative for abdominal pain, blood in stool, constipation, diarrhea, heartburn, nausea and vomiting.  ?Genitourinary:  Negative for dysuria and frequency.  ?     Vaginal discharge, see STD flowsheet.   ?Musculoskeletal:  Negative for back pain, joint pain and neck pain.  ?Skin:  Negative for itching and rash.  ?Neurological:  Negative for dizziness, weakness and headaches.  ?Endo/Heme/Allergies:  Does not bruise/bleed easily.  ?Psychiatric/Behavioral:  Negative for depression, substance abuse and suicidal ideas.   ? ?The following portions of the patient's history were reviewed and updated as appropriate: allergies, current medications, past family history, past medical history, past social history, past surgical history and problem list. Problem list updated. ? ? ?See flowsheet for other program required questions. ? ?Objective:  ? ?Vitals:  ? 06/09/21 1341  ?BP: 132/88  ?Weight: 195 lb 9.6 oz (88.7 kg)  ?Height: 5'  7" (1.702 m)  ? ? ?Physical Exam ?Constitutional:   ?   Appearance: Normal appearance.  ?HENT:  ?   Head: Normocephalic.  ?   Right Ear: External ear normal.  ?   Left Ear: External ear normal.  ?   Nose: Nose normal.  ?   Mouth/Throat:  ?   Mouth: Mucous membranes are moist.  ?Pulmonary:  ?   Effort: Pulmonary effort is normal.  ?Abdominal:  ?   General: Abdomen is flat.  ?   Palpations: Abdomen is soft.  ?Genitourinary: ?   Comments: Deferred, patient desires to self collect.  ?Musculoskeletal:  ?   Cervical back: Full passive range of motion without pain, normal range of motion and neck supple.  ?Skin: ?   General: Skin is warm and dry.  ?Neurological:  ?   Mental Status: She is alert and oriented to person, place, and time.  ?Psychiatric:     ?   Attention and Perception: Attention normal.     ?   Mood and Affect: Mood normal.     ?   Speech: Speech normal.     ?   Behavior: Behavior is cooperative.  ? ? ? ? ?Assessment and Plan:  ?[name] is a 46 y.o. female presenting to the [facility] Department for a Women's Health problem visit ? ?1. Family planning ?-46 year old in clinic today for STD screening and desire birth control.  Due to history of smoking, will start patient today on 1 pack of  Micronor.  Patient to return to clinic for additional packs of pills and physical.  Appointment scheduled for 06/19/21.   ? ?- norethindrone (MICRONOR) 0.35 MG tablet; Take 1 tablet (0.35 mg total) by mouth daily.  Dispense: 28 tablet; Refill: 0 ? ? ? ? ?Return in about 10 days (around 06/19/2021). ? ?Future Appointments  ?Date Time Provider [place]  ?06/19/2021  9:40 AM AC-FP PROVIDER AC-FAM None  ? ? ?Gregary Cromer, FNP ? ?

## 2021-06-09 NOTE — Progress Notes (Signed)
BCM consent signed, Caitlin Wheeler and quitline cards given. Patient given 1 pac OC and scheduled to return for PE on 06/19/21. Wet mount reviewed and patient treated for yeast per provider orders.Burt Knack, RN  ?

## 2021-06-09 NOTE — Progress Notes (Signed)
Patient here for STI testing. Would like to start OC today. Planning to get married next week.Burt Knack, RN  ?
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
## 2021-06-09 NOTE — Progress Notes (Signed)
Patient was here for STD testing and wanted to talk about OC. Given 1 pack OC and scheduled for return PE on 06/19/21. Consent signed. See other document from today. Repeat BP was 122/79, provider aware.Burt Knack, RN  ?

## 2021-06-09 NOTE — Progress Notes (Signed)
Wet mount reviewed, patient treated for yeast per provider orders.Marland KitchenMarland KitchenBurt Knack, RN  ?

## 2021-06-14 LAB — GONOCOCCUS CULTURE

## 2021-06-19 ENCOUNTER — Ambulatory Visit: Payer: Self-pay

## 2021-11-15 ENCOUNTER — Emergency Department
Admission: EM | Admit: 2021-11-15 | Discharge: 2021-11-15 | Disposition: A | Discharge status: Home / Self Care | Payer: Self-pay | Attending: Emergency Medicine | Admitting: Emergency Medicine

## 2021-11-15 ENCOUNTER — Other Ambulatory Visit: Payer: Self-pay

## 2021-11-15 ENCOUNTER — Emergency Department: Payer: Self-pay

## 2021-11-15 DIAGNOSIS — Z7901 Long term (current) use of anticoagulants: Secondary | ICD-10-CM | POA: Insufficient documentation

## 2021-11-15 DIAGNOSIS — J189 Pneumonia, unspecified organism: Secondary | ICD-10-CM

## 2021-11-15 DIAGNOSIS — T40601A Poisoning by unspecified narcotics, accidental (unintentional), initial encounter: Secondary | ICD-10-CM

## 2021-11-15 DIAGNOSIS — J181 Lobar pneumonia, unspecified organism: Secondary | ICD-10-CM | POA: Insufficient documentation

## 2021-11-15 DIAGNOSIS — R000 Tachycardia, unspecified: Secondary | ICD-10-CM | POA: Insufficient documentation

## 2021-11-15 LAB — URINE DRUG SCREEN, QUALITATIVE (ARMC ONLY)
Amphetamines, Ur Screen: NOT DETECTED
Barbiturates, Ur Screen: NOT DETECTED
Benzodiazepine, Ur Scrn: NOT DETECTED
Cannabinoid 50 Ng, Ur ~~LOC~~: NOT DETECTED
Cocaine Metabolite,Ur ~~LOC~~: POSITIVE — AB
MDMA (Ecstasy)Ur Screen: NOT DETECTED
Methadone Scn, Ur: NOT DETECTED
Opiate, Ur Screen: POSITIVE — AB
Phencyclidine (PCP) Ur S: NOT DETECTED
Tricyclic, Ur Screen: NOT DETECTED

## 2021-11-15 LAB — URINALYSIS, ROUTINE W REFLEX MICROSCOPIC
Bilirubin Urine: NEGATIVE
Glucose, UA: NEGATIVE mg/dL
Ketones, ur: 5 mg/dL — AB
Leukocytes,Ua: NEGATIVE
Nitrite: NEGATIVE
Protein, ur: 30 mg/dL — AB
Specific Gravity, Urine: 1.042 — ABNORMAL HIGH (ref 1.005–1.030)
pH: 5 (ref 5.0–8.0)

## 2021-11-15 LAB — CBC WITH DIFFERENTIAL/PLATELET
Abs Immature Granulocytes: 0.57 10*3/uL — ABNORMAL HIGH (ref 0.00–0.07)
Basophils Absolute: 0.1 10*3/uL (ref 0.0–0.1)
Basophils Relative: 1 %
Eosinophils Absolute: 0 10*3/uL (ref 0.0–0.5)
Eosinophils Relative: 0 %
HCT: 42.7 % (ref 36.0–46.0)
Hemoglobin: 13.4 g/dL (ref 12.0–15.0)
Immature Granulocytes: 2 %
Lymphocytes Relative: 5 %
Lymphs Abs: 1.3 10*3/uL (ref 0.7–4.0)
MCH: 30.8 pg (ref 26.0–34.0)
MCHC: 31.4 g/dL (ref 30.0–36.0)
MCV: 98.2 fL (ref 80.0–100.0)
Monocytes Absolute: 0.6 10*3/uL (ref 0.1–1.0)
Monocytes Relative: 2 %
Neutro Abs: 21.5 10*3/uL — ABNORMAL HIGH (ref 1.7–7.7)
Neutrophils Relative %: 90 %
Platelets: 362 10*3/uL (ref 150–400)
RBC: 4.35 MIL/uL (ref 3.87–5.11)
RDW: 15.9 % — ABNORMAL HIGH (ref 11.5–15.5)
WBC: 24 10*3/uL — ABNORMAL HIGH (ref 4.0–10.5)
nRBC: 0 % (ref 0.0–0.2)

## 2021-11-15 LAB — COMPREHENSIVE METABOLIC PANEL WITH GFR
ALT: 243 U/L — ABNORMAL HIGH (ref 0–44)
AST: 282 U/L — ABNORMAL HIGH (ref 15–41)
Albumin: 4 g/dL (ref 3.5–5.0)
Alkaline Phosphatase: 103 U/L (ref 38–126)
Anion gap: 14 (ref 5–15)
BUN: 10 mg/dL (ref 6–20)
CO2: 16 mmol/L — ABNORMAL LOW (ref 22–32)
Calcium: 8.4 mg/dL — ABNORMAL LOW (ref 8.9–10.3)
Chloride: 111 mmol/L (ref 98–111)
Creatinine, Ser: 1.33 mg/dL — ABNORMAL HIGH (ref 0.44–1.00)
GFR, Estimated: 50 mL/min — ABNORMAL LOW (ref >60)
Glucose, Bld: 135 mg/dL — ABNORMAL HIGH (ref 70–99)
Potassium: 4.3 mmol/L (ref 3.5–5.1)
Sodium: 141 mmol/L (ref 135–145)
Total Bilirubin: 0.5 mg/dL (ref 0.3–1.2)
Total Protein: 7.5 g/dL (ref 6.5–8.1)

## 2021-11-15 LAB — HCG, QUANTITATIVE, PREGNANCY: hCG, Beta Chain, Quant, S: <1 m[IU]/mL (ref <5)

## 2021-11-15 LAB — TROPONIN I (HIGH SENSITIVITY)
Troponin I (High Sensitivity): 16 ng/L (ref <18)
Troponin I (High Sensitivity): 26 ng/L — ABNORMAL HIGH (ref <18)

## 2021-11-15 LAB — ACETAMINOPHEN LEVEL: Acetaminophen (Tylenol), Serum: <10 ug/mL — ABNORMAL LOW (ref 10–30)

## 2021-11-15 LAB — ETHANOL: Alcohol, Ethyl (B): 21 mg/dL — ABNORMAL HIGH (ref <10)

## 2021-11-15 LAB — MAGNESIUM: Magnesium: 2.8 mg/dL — ABNORMAL HIGH (ref 1.7–2.4)

## 2021-11-15 LAB — SALICYLATE LEVEL: Salicylate Lvl: <7 mg/dL — ABNORMAL LOW (ref 7.0–30.0)

## 2021-11-15 MED ORDER — LACTATED RINGERS IV BOLUS
1000.0000 mL | Freq: Once | INTRAVENOUS | Status: AC
  Administered 2021-11-15: 1000 mL via INTRAVENOUS

## 2021-11-15 MED ORDER — SODIUM CHLORIDE 0.9 % IV SOLN
3.0000 g | Freq: Once | INTRAVENOUS | Status: AC
  Administered 2021-11-15: 3 g via INTRAVENOUS
  Filled 2021-11-15: qty 8

## 2021-11-15 MED ORDER — AMOXICILLIN-POT CLAVULANATE 875-125 MG PO TABS
1.0000 | ORAL_TABLET | Freq: Two times a day (BID) | ORAL | 0 refills | Status: AC
Start: 2021-11-15 — End: 2021-11-22

## 2021-11-15 MED ORDER — IOHEXOL 350 MG/ML SOLN
75.0000 mL | Freq: Once | INTRAVENOUS | Status: AC | PRN
  Administered 2021-11-15: 75 mL via INTRAVENOUS

## 2021-11-15 MED ORDER — RIVAROXABAN 20 MG PO TABS: 20.0000 mg | ORAL_TABLET | Freq: Every day | ORAL | 1 refills | Status: DC

## 2021-11-15 NOTE — ED Triage Notes (Signed)
Pt presents to ER via ems from an unknown residence.  Per ems, they were called out for unresponsive pt.  On arrival, pt was found unconscious, with snoring respirations.  Pt given 2mg  IN narcan by ems without response and then given 2mg  IV narcan with pt becoming arousable.  Pt denies any drug use at this time.  CBG appx 500 w/ems.  Pt is A&O x2 at this time in NAD in ED room.

## 2021-11-15 NOTE — Discharge Instructions (Signed)
You are being discharged with prescription for Augmentin antibiotics to take 2 times daily for the next 7 days to treat a possible pneumonia

## 2021-11-15 NOTE — ED Notes (Signed)
Pt more awake and now reports snorting 1 line of heroin. Denies intentional OD. MD made aware.
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
## 2021-11-15 NOTE — ED Notes (Signed)
Follow up pcp all info provided all questions answered , pt ambulatory pt axox4

## 2021-11-15 NOTE — ED Notes (Signed)
Patient resting in bed free from sign of distress. Breathing unlabored speaking in full sentences with symmetric chest rise and fall. Bed low and locked with side rails raised x2. Call bell in reach and monitor in place. Pt in clear view of nurses station .   

## 2021-11-15 NOTE — ED Notes (Signed)
Patient resting in bed free from sign of distress. Breathing unlabored speaking in full sentences with symmetric chest rise and fall. Bed low and locked with side rails raised x2. Call bell in reach and monitor in place. Pt in clear view of nurses station .

## 2021-11-15 NOTE — ED Provider Notes (Addendum)
-----------------------------------------   11:57 AM on 11/15/2021 -----------------------------------------  Patient is alert and oriented and ambulating without difficulty.  Vital signs are stable.  She does not require any oxygen.  She is stable for discharge as planned.       Dionne Bucy, MD 11/15/21 1158

## 2022-10-29 ENCOUNTER — Encounter: Payer: Self-pay | Admitting: Family Medicine

## 2022-10-29 ENCOUNTER — Ambulatory Visit
Admission: EM | Admit: 2022-10-29 | Discharge: 2022-10-29 | Disposition: A | Discharge status: Home / Self Care | Payer: 59 | Attending: Urgent Care | Admitting: Urgent Care

## 2022-10-29 ENCOUNTER — Ambulatory Visit: Payer: 59 | Admitting: Family Medicine

## 2022-10-29 VITALS — Temp 97.2°F

## 2022-10-29 DIAGNOSIS — N898 Other specified noninflammatory disorders of vagina: Secondary | ICD-10-CM | POA: Diagnosis present

## 2022-10-29 DIAGNOSIS — N3000 Acute cystitis without hematuria: Secondary | ICD-10-CM | POA: Diagnosis present

## 2022-10-29 DIAGNOSIS — Z113 Encounter for screening for infections with a predominantly sexual mode of transmission: Secondary | ICD-10-CM

## 2022-10-29 LAB — POCT URINALYSIS DIP (MANUAL ENTRY)
Blood, UA: NEGATIVE
Glucose, UA: NEGATIVE mg/dL
Nitrite, UA: NEGATIVE
Protein Ur, POC: 30 mg/dL — AB
Spec Grav, UA: >=1.03 — AB (ref 1.010–1.025)
Urobilinogen, UA: 1 E.U./dL
pH, UA: 6 (ref 5.0–8.0)

## 2022-10-29 LAB — HM HIV SCREENING LAB: HM HIV Screening: NEGATIVE

## 2022-10-29 LAB — HEPATITIS B SURFACE ANTIGEN: Hepatitis B Surface Ag: NONREACTIVE

## 2022-10-29 LAB — POCT URINE PREGNANCY: Preg Test, Ur: NEGATIVE

## 2022-10-29 LAB — WET PREP FOR TRICH, YEAST, CLUE
Trichomonas Exam: NEGATIVE
Yeast Exam: NEGATIVE

## 2022-10-29 LAB — HM HEPATITIS C SCREENING LAB: HM Hepatitis Screen: NEGATIVE

## 2022-10-29 MED ORDER — NITROFURANTOIN MONOHYD MACRO 100 MG PO CAPS
100.0000 mg | ORAL_CAPSULE | Freq: Two times a day (BID) | ORAL | 0 refills | Status: DC
Start: 2022-10-29 — End: 2022-11-04

## 2022-10-29 MED ORDER — NITROFURANTOIN MONOHYD MACRO 100 MG PO CAPS
100.0000 mg | ORAL_CAPSULE | Freq: Two times a day (BID) | ORAL | 0 refills | Status: DC
Start: 2022-10-29 — End: 2022-10-29

## 2022-10-29 NOTE — ED Triage Notes (Addendum)
Patient to Urgent Care with complaints of suprapubic pressure and a large amount of yellow vaginal discharge. Has had some painful urination. Symptoms started four days ago.  Patient was seen at the health department today and screened for STDs/ wet prep. All tests were negative. Reports she has not had intercourse since September.

## 2022-10-29 NOTE — Progress Notes (Signed)
10-29-2022 Called by clerical supervisor to speak to walk-in patient. Per discussion with patent: reports lower abd pain, constance since onset of menses. Patient unable to remember first day of last menses but stated she bled for 2 1/2 weeks. Patient reports last day of menses 10-25-2022. Since menses stopped, patient reports yellow-gray vaginal discharge. Per patient: last sex 12-2021, no appetite and continued lower abdominal pain. Patient has not checked her temperature. Reports continued constant low abd pain. Discussed with Lenice Llamas FNP who agreed to see patient in [facility] STI clinic. Herby Abraham RN.
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
## 2022-10-29 NOTE — Discharge Instructions (Signed)
Follow up here or with your primary care provider if your symptoms are worsening or not improving.     
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
## 2022-10-29 NOTE — Progress Notes (Signed)
Pt here for STI screening.  Wet mount results reviewed with patient.  No treatment needed at this time as per standing orders.  Per Aliene Altes, FNP, pt advised to go to Urgent Care for further evaluation of possible UTI.  Pt verbalizes understanding.  Pt provided with PCP list.  Condoms declined.-Collins Scotland, RN

## 2022-11-01 ENCOUNTER — Other Ambulatory Visit: Payer: Self-pay

## 2022-11-01 ENCOUNTER — Emergency Department: Payer: 59

## 2022-11-01 ENCOUNTER — Inpatient Hospital Stay
Admission: EM | Admit: 2022-11-01 | Discharge: 2022-11-04 | DRG: 872 | Disposition: A | Discharge status: Home / Self Care | Payer: 59 | Attending: Internal Medicine | Admitting: Internal Medicine

## 2022-11-01 DIAGNOSIS — Z86711 Personal history of pulmonary embolism: Secondary | ICD-10-CM

## 2022-11-01 DIAGNOSIS — A419 Sepsis, unspecified organism: Principal | ICD-10-CM | POA: Diagnosis present

## 2022-11-01 DIAGNOSIS — Z8249 Family history of ischemic heart disease and other diseases of the circulatory system: Secondary | ICD-10-CM | POA: Diagnosis not present

## 2022-11-01 DIAGNOSIS — E860 Dehydration: Secondary | ICD-10-CM | POA: Diagnosis present

## 2022-11-01 DIAGNOSIS — Z86718 Personal history of other venous thrombosis and embolism: Secondary | ICD-10-CM | POA: Diagnosis not present

## 2022-11-01 DIAGNOSIS — N730 Acute parametritis and pelvic cellulitis: Secondary | ICD-10-CM

## 2022-11-01 DIAGNOSIS — Z9049 Acquired absence of other specified parts of digestive tract: Secondary | ICD-10-CM

## 2022-11-01 DIAGNOSIS — A5424 Gonococcal female pelvic inflammatory disease: Secondary | ICD-10-CM | POA: Diagnosis present

## 2022-11-01 DIAGNOSIS — N7093 Salpingitis and oophoritis, unspecified: Secondary | ICD-10-CM | POA: Diagnosis not present

## 2022-11-01 DIAGNOSIS — N7011 Chronic salpingitis: Secondary | ICD-10-CM | POA: Diagnosis not present

## 2022-11-01 DIAGNOSIS — N83292 Other ovarian cyst, left side: Secondary | ICD-10-CM | POA: Diagnosis present

## 2022-11-01 DIAGNOSIS — Z91040 Latex allergy status: Secondary | ICD-10-CM

## 2022-11-01 DIAGNOSIS — F17210 Nicotine dependence, cigarettes, uncomplicated: Secondary | ICD-10-CM | POA: Diagnosis present

## 2022-11-01 DIAGNOSIS — F1911 Other psychoactive substance abuse, in remission: Secondary | ICD-10-CM | POA: Diagnosis present

## 2022-11-01 LAB — CBC WITH DIFFERENTIAL/PLATELET
Abs Immature Granulocytes: 0.11 10*3/uL — ABNORMAL HIGH (ref 0.00–0.07)
Basophils Absolute: 0.1 10*3/uL (ref 0.0–0.1)
Basophils Relative: 0 %
Eosinophils Absolute: 0 10*3/uL (ref 0.0–0.5)
Eosinophils Relative: 0 %
HCT: 40.8 % (ref 36.0–46.0)
Hemoglobin: 13.3 g/dL (ref 12.0–15.0)
Immature Granulocytes: 1 %
Lymphocytes Relative: 7 %
Lymphs Abs: 1.4 10*3/uL (ref 0.7–4.0)
MCH: 30.5 pg (ref 26.0–34.0)
MCHC: 32.6 g/dL (ref 30.0–36.0)
MCV: 93.6 fL (ref 80.0–100.0)
Monocytes Absolute: 0.7 10*3/uL (ref 0.1–1.0)
Monocytes Relative: 3 %
Neutro Abs: 19.4 10*3/uL — ABNORMAL HIGH (ref 1.7–7.7)
Neutrophils Relative %: 89 %
Platelets: 482 10*3/uL — ABNORMAL HIGH (ref 150–400)
RBC: 4.36 MIL/uL (ref 3.87–5.11)
RDW: 14.2 % (ref 11.5–15.5)
WBC: 21.7 10*3/uL — ABNORMAL HIGH (ref 4.0–10.5)
nRBC: 0 % (ref 0.0–0.2)

## 2022-11-01 LAB — URINALYSIS, ROUTINE W REFLEX MICROSCOPIC
Bilirubin Urine: NEGATIVE
Glucose, UA: NEGATIVE mg/dL
Hgb urine dipstick: NEGATIVE
Ketones, ur: 5 mg/dL — AB
Nitrite: NEGATIVE
Protein, ur: 100 mg/dL — AB
Specific Gravity, Urine: 1.027 (ref 1.005–1.030)
WBC, UA: >50 WBC/hpf (ref 0–5)
pH: 5 (ref 5.0–8.0)

## 2022-11-01 LAB — PROTIME-INR
INR: 1.1 (ref 0.8–1.2)
Prothrombin Time: 14.2 seconds (ref 11.4–15.2)

## 2022-11-01 LAB — COMPREHENSIVE METABOLIC PANEL
ALT: 17 U/L (ref 0–44)
AST: 19 U/L (ref 15–41)
Albumin: 4 g/dL (ref 3.5–5.0)
Alkaline Phosphatase: 81 U/L (ref 38–126)
Anion gap: 15 (ref 5–15)
BUN: 11 mg/dL (ref 6–20)
CO2: 17 mmol/L — ABNORMAL LOW (ref 22–32)
Calcium: 9.3 mg/dL (ref 8.9–10.3)
Chloride: 104 mmol/L (ref 98–111)
Creatinine, Ser: 0.77 mg/dL (ref 0.44–1.00)
GFR, Estimated: >60 mL/min (ref >60)
Glucose, Bld: 178 mg/dL — ABNORMAL HIGH (ref 70–99)
Potassium: 3.6 mmol/L (ref 3.5–5.1)
Sodium: 136 mmol/L (ref 135–145)
Total Bilirubin: 0.5 mg/dL (ref 0.3–1.2)
Total Protein: 8.7 g/dL — ABNORMAL HIGH (ref 6.5–8.1)

## 2022-11-01 LAB — LACTIC ACID, PLASMA
Lactic Acid, Venous: 2.3 mmol/L (ref 0.5–1.9)
Lactic Acid, Venous: 1.1 mmol/L (ref 0.5–1.9)

## 2022-11-01 LAB — LIPASE, BLOOD: Lipase: 26 U/L (ref 11–51)

## 2022-11-01 LAB — POC URINE PREG, ED: Preg Test, Ur: NEGATIVE

## 2022-11-01 MED ORDER — IOHEXOL 300 MG/ML  SOLN
100.0000 mL | Freq: Once | INTRAMUSCULAR | Status: AC | PRN
  Administered 2022-11-01: 100 mL via INTRAVENOUS

## 2022-11-01 MED ORDER — SODIUM CHLORIDE 0.9 % IV BOLUS (SEPSIS)
1000.0000 mL | Freq: Once | INTRAVENOUS | Status: AC
  Administered 2022-11-01: 1000 mL via INTRAVENOUS

## 2022-11-01 MED ORDER — SODIUM CHLORIDE 0.9 % IV SOLN
2.0000 g | INTRAVENOUS | Status: DC
  Administered 2022-11-01: 2 g via INTRAVENOUS
  Filled 2022-11-01: qty 20

## 2022-11-01 MED ORDER — ONDANSETRON HCL 4 MG/2ML IJ SOLN
4.0000 mg | Freq: Once | INTRAMUSCULAR | Status: AC
  Administered 2022-11-01: 4 mg via INTRAVENOUS
  Filled 2022-11-01: qty 2

## 2022-11-01 MED ORDER — MORPHINE SULFATE (PF) 4 MG/ML IV SOLN
4.0000 mg | Freq: Once | INTRAVENOUS | Status: AC
  Administered 2022-11-01: 4 mg via INTRAVENOUS
  Filled 2022-11-01: qty 1

## 2022-11-01 NOTE — Assessment & Plan Note (Signed)
Patient not currently on systemic anticoagulation. 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

## 2022-11-01 NOTE — Assessment & Plan Note (Signed)
Prior history of heroin, cocaine, alcohol abuse

## 2022-11-01 NOTE — ED Provider Notes (Signed)
[facility] Provider Note    Event Date/Time   First MD Initiated Contact with Patient 11/01/22 [phone]     (approximate)   History   Abdominal Pain   HPI  Caitlin Wheeler is a 47 y.o. female with a history of substance abuse, who presents with complaints of lower abdominal discomfort, dysuria for about 2 weeks.  She was having vaginal discharge, now improved.  She reports she went to the health department and tested negative for STDs.  She does describe burning with urination but reportedly had a negative urinalysis as an outpatient recently.  Denies flank pain.     Physical Exam   Triage Vital Signs: ED Triage Vitals  Encounter Vitals Group     BP 11/01/22 1543 (!) 142/94     Systolic BP Percentile --      Diastolic BP Percentile --      Pulse Rate 11/01/22 1543 (!) 146     Resp 11/01/22 1543 (!) 22     Temp 11/01/22 1543 (!) 101.3 F (38.5 C)     Temp Source 11/01/22 1543 Oral     SpO2 11/01/22 1543 95 %     Weight 11/01/22 1544 89.8 kg (198 lb)     Height 11/01/22 1544 1.702 m (5\' 7" )     Head Circumference --      Peak Flow --      Pain Score 11/01/22 1544 7     Pain Loc --      Pain Education --      Exclude from Growth Chart --     Most recent vital signs: Vitals:   11/01/22 1900 11/01/22 1930  BP: 101/79 116/68  Pulse: 99 94  Resp: 20 20  Temp:    SpO2: 99% 100%     General: Awake, no distress.  CV:  Good peripheral perfusion.  Tachycardia Resp:  Normal effort.  Abd:  No distention.  Mild tenderness suprapubically, no CVA tenderness Other:     ED Results / Procedures / Treatments   Labs (all labs ordered are listed, but only abnormal results are displayed) Labs Reviewed  COMPREHENSIVE METABOLIC PANEL - Abnormal; Notable for the following components:      Result Value   CO2 17 (*)    Glucose, Bld 178 (*)    Total Protein 8.7 (*)    All other components within normal limits  CBC WITH DIFFERENTIAL/PLATELET -  Abnormal; Notable for the following components:   WBC 21.7 (*)    Platelets 482 (*)    Neutro Abs 19.4 (*)    Abs Immature Granulocytes 0.11 (*)    All other components within normal limits  URINALYSIS, ROUTINE W REFLEX MICROSCOPIC - Abnormal; Notable for the following components:   Color, Urine AMBER (*)    APPearance CLOUDY (*)    Ketones, ur 5 (*)    Protein, ur 100 (*)    Leukocytes,Ua LARGE (*)    Bacteria, UA RARE (*)    All other components within normal limits  LACTIC ACID, PLASMA - Abnormal; Notable for the following components:   Lactic Acid, Venous 2.3 (*)    All other components within normal limits  CULTURE, BLOOD (ROUTINE X 2)  CULTURE, BLOOD (ROUTINE X 2)  LIPASE, BLOOD  LACTIC ACID, PLASMA  PROTIME-INR  POC URINE PREG, ED     EKG ED ECG REPORT I, Jene Every, the attending physician, personally viewed and interpreted this ECG.  Date: 11/01/2022  Rhythm:  Sinus tachycardia QRS Axis: normal Intervals: normal ST/T Wave abnormalities: normal Narrative Interpretation: no evidence of acute ischemia     RADIOLOGY Chest x-ray negative for acute abnormality    PROCEDURES:  Critical Care performed: yes  CRITICAL CARE Performed by: Jene Every   Total critical care time: 30 minutes  Critical care time was exclusive of separately billable procedures and treating other patients.  Critical care was necessary to treat or prevent imminent or life-threatening deterioration.  Critical care was time spent personally by me on the following activities: development of treatment plan with patient and/or surrogate as well as nursing, discussions with consultants, evaluation of patient's response to treatment, examination of patient, obtaining history from patient or surrogate, ordering and performing treatments and interventions, ordering and review of laboratory studies, ordering and review of radiographic studies, pulse oximetry and re-evaluation of patient's  condition.   Procedures   MEDICATIONS ORDERED IN ED: Medications  cefTRIAXone (ROCEPHIN) 2 g in sodium chloride 0.9 % 100 mL IVPB (0 g Intravenous Stopped 11/01/22 1806)  sodium chloride 0.9 % bolus 1,000 mL (1,000 mLs Intravenous New Bag/Given 11/01/22 1647)  morphine (PF) 4 MG/ML injection 4 mg (4 mg Intravenous Given 11/01/22 1649)  ondansetron (ZOFRAN) injection 4 mg (4 mg Intravenous Given 11/01/22 1648)  iohexol (OMNIPAQUE) 300 MG/ML solution 100 mL (100 mLs Intravenous Contrast Given 11/01/22 1725)     IMPRESSION / MDM / ASSESSMENT AND PLAN / ED COURSE  I reviewed the triage vital signs and the nursing notes. Patient's presentation is most consistent with acute presentation with potential threat to life or bodily function.  Patient presents with fever, tachycardia dysuria and lower abdominal discomfort.  Suspicious for sepsis related to urinary tract infection, differential includes infected kidney stone, less likely PID given outpatient workup  Code sepsis activated  Chest x-ray negative for pneumonia  Elevated lactic acid of 2.3  White blood cell count of 21.7  Urinalysis is consistent with infection, will treat with IV Rocephin, IV fluid bolus infusing, normal blood pressures  Pending CT abdomen pelvis ----------------------------------------- 7:53 PM on 11/01/2022 -----------------------------------------  CT reads are delayed due to PACS error, no clear stone on my read, suspicious for pyelonephritis.  Have discussed with the hospitalist who will follow-up on CT scan      FINAL CLINICAL IMPRESSION(S) / ED DIAGNOSES   Final diagnoses:  Sepsis without acute organ dysfunction, due to unspecified organism [facility])     Rx / DC Orders   ED Discharge Orders     None        Note:  This document was prepared using Dragon voice recognition software and may include unintentional dictation errors.   Jene Every, MD 11/01/22 [phone]

## 2022-11-01 NOTE — ED Notes (Signed)
Pt states she has been clean from cocaine for a few months but relapsed today.

## 2022-11-01 NOTE — H&P (Signed)
History and Physical    Patient: Caitlin Wheeler TKZ:[id number] DOB: 02-26-1976 DOA: 11/01/2022 DOS: the patient was seen and examined on 11/01/2022 PCP: Pcp, No  Patient coming from: Home  Chief Complaint:  Chief Complaint  Patient presents with   Abdominal Pain    HPI: Caitlin Wheeler is a 47 y.o. 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 she returns with persistent, plaints of lower abdominal discomfort and dysuria.  She previously had a vaginal discharge but this has cleared up.  She denies fever or chills. ED course and data review: Temperature 101.3 with pulse 146, respirations 22, BP 142/94. WBC 21,000 with lactic acid 2.3>1.1.  Urinalysis showed large leukocyte esterase. EKG, personally viewed and interpreted showing sinus tachycardia at 132 with no other concerning findings CT abdomen and pelvis pending. Patient started on ceftriaxone and sepsis fluids due to sepsis from possible pyelonephritis Hospitalist consulted for admission  Following admission, CT abdomen and pelvis showed findings concerning for complex left ovarian lesion as follows: IMPRESSION: 1. Suggestion of a complex left ovarian lesion. Concern for possible malignancy. Recommend pelvic ultrasound. 2. A 5.1 cm right ovarian simple cystic lesion. Recommend pelvic ultrasound given size. 3. Prominent upper limits of normal left retroperitoneal lymph nodes. Recommend attention on follow-up.  Pelvic ultrasound ordered and pending   Review of Systems: As  mentioned in the history of present illness. All other systems reviewed and are negative.  Past Medical History:  Diagnosis Date   Abnormal Pap smear of cervix    DVT (deep vein thrombosis) in pregnancy    Pulmonary embolism [facility])    Past Surgical History:  Procedure Laterality Date   BUNIONECTOMY Bilateral    CHOLECYSTECTOMY     NECK SURGERY     Social History:  reports that she has been smoking cigarettes. She started smoking about 32 years ago. She has a 8.1 pack-year smoking history. She has never used smokeless tobacco. She reports current alcohol use of about 4.0 standard drinks of alcohol per week. She reports current drug use. Drugs: "Crack" cocaine and Marijuana.  Allergies  Allergen Reactions   Latex Itching and Rash    Family History  Problem Relation Age of Onset   Hypertension Mother     Prior to Admission medications   Medication Sig Start Date End Date Taking? Authorizing Provider  Diclofenac Sodium (DICLO GEL) 1 % KIT Place onto the skin. Patient not taking: Reported on 09/21/2020    [provider]  gabapentin (NEURONTIN) 300 MG capsule Take 900 mg by mouth. Patient not taking: Reported on 09/21/2020 03/12/13   [provider]  gabapentin (NEURONTIN) 600 MG tablet Take 1,800 mg by mouth 3 (three) times daily. Patient not taking: Reported on 09/21/2020    [provider]  nitrofurantoin, macrocrystal-monohydrate, (MACROBID) 100 MG capsule Take 1 capsule (100 mg total) by mouth 2 (two) times daily. 10/29/22   Immordino, Jeannett Senior, FNP  norethindrone (MICRONOR) 0.35 MG tablet Take 1 tablet (0.35 mg total) by mouth daily. Patient not taking: Reported on 10/29/2022 06/09/21   Glenna Fellows, FNP  rivaroxaban (  XARELTO) 20 MG TABS tablet Take 1 tablet (20 mg total) by mouth daily with supper. Patient not taking: Reported on 10/29/2022 11/15/21   Delton Prairie, MD    Physical Exam: Vitals:   11/01/22 1630 11/01/22 1830 11/01/22 1900 11/01/22 1930  BP:  138/83 120/70 101/79 116/68  Pulse: (!) 124 98 99 94  Resp: (!) 21 18 20 20   Temp:      TempSrc:      SpO2: 97% 100% 99% 100%  Weight:      Height:       Physical Exam Vitals and nursing note reviewed.  Constitutional:      General: She is not in acute distress. HENT:     Head: Normocephalic and atraumatic.  Cardiovascular:     Rate and Rhythm: Normal rate and regular rhythm.     Heart sounds: Normal heart sounds.  Pulmonary:     Effort: Pulmonary effort is normal.     Breath sounds: Normal breath sounds.  Abdominal:     Palpations: Abdomen is soft.     Tenderness: There is abdominal tenderness in the right lower quadrant, suprapubic area and left lower quadrant.  Neurological:     Mental Status: Mental status is at baseline.     Labs on Admission: I have personally reviewed following labs and imaging studies  CBC: Recent Labs  Lab 11/01/22 1557  WBC 21.7*  NEUTROABS 19.4*  HGB 13.3  HCT 40.8  MCV 93.6  PLT 482*   Basic Metabolic Panel: Recent Labs  Lab 11/01/22 1557  NA 136  K 3.6  CL 104  CO2 17*  GLUCOSE 178*  BUN 11  CREATININE 0.77  CALCIUM 9.3   GFR: Estimated Creatinine Clearance: 101.1 mL/min (by C-G formula based on SCr of 0.77 mg/dL). Liver Function Tests: Recent Labs  Lab 11/01/22 1557  AST 19  ALT 17  ALKPHOS 81  BILITOT 0.5  PROT 8.7*  ALBUMIN 4.0   Recent Labs  Lab 11/01/22 1557  LIPASE 26   No results for input(s): "AMMONIA" in the last 168 hours. Coagulation Profile: Recent Labs  Lab 11/01/22 1557  INR 1.1   Cardiac Enzymes: No results for input(s): "CKTOTAL", "CKMB", "CKMBINDEX", "TROPONINI" in the last 168 hours. BNP (last 3 results) No results for input(s): "PROBNP" in the last 8760 hours. HbA1C: No results for input(s): "HGBA1C" in the last 72 hours. CBG: No results for input(s): "GLUCAP" in the last 168 hours. Lipid Profile: No results for input(s): "CHOL", "HDL", "LDLCALC", "TRIG", "CHOLHDL", "LDLDIRECT" in  the last 72 hours. Thyroid Function Tests: No results for input(s): "TSH", "T4TOTAL", "FREET4", "T3FREE", "THYROIDAB" in the last 72 hours. Anemia Panel: No results for input(s): "VITAMINB12", "FOLATE", "FERRITIN", "TIBC", "IRON", "RETICCTPCT" in the last 72 hours. Urine analysis:    Component Value Date/Time   COLORURINE AMBER (A) 11/01/2022 1557   APPEARANCEUR CLOUDY (A) 11/01/2022 1557   LABSPEC 1.027 11/01/2022 1557   PHURINE 5.0 11/01/2022 1557   GLUCOSEU NEGATIVE 11/01/2022 1557   HGBUR NEGATIVE 11/01/2022 1557   BILIRUBINUR NEGATIVE 11/01/2022 1557   BILIRUBINUR small (A) 10/29/2022 0959   KETONESUR 5 (A) 11/01/2022 1557   PROTEINUR 100 (A) 11/01/2022 1557   UROBILINOGEN 1.0 10/29/2022 0959   NITRITE NEGATIVE 11/01/2022 1557   LEUKOCYTESUR LARGE (A) 11/01/2022 1557    Radiological Exams on Admission: CT ABDOMEN PELVIS W CONTRAST  Result Date: 11/01/2022 CLINICAL DATA:  Sepsis EXAM: CT ABDOMEN AND PELVIS WITH CONTRAST TECHNIQUE: Multidetector CT imaging of the abdomen and  pelvis was performed using the standard protocol following bolus administration of intravenous contrast. RADIATION DOSE REDUCTION: This exam was performed according to the departmental dose-optimization program which includes automated exposure control, adjustment of the mA and/or kV according to patient size and/or use of iterative reconstruction technique. CONTRAST:  OMNIPAQUE IOHEXOL 300 MG/ML  SOLN COMPARISON:  None Available. FINDINGS: Lower chest: Couple subpleural triangular pulmonary micronodule within the right lung likely of intrapulmonary lymph nodes - no further follow-up indicated. No acute abnormality. Hepatobiliary: No focal liver abnormality. Status post cholecystectomy. No biliary dilatation. Pancreas: No focal lesion. Normal pancreatic contour. No surrounding inflammatory changes. No main pancreatic ductal dilatation. Spleen: Normal in size without focal abnormality. Adrenals/Urinary Tract: No  adrenal nodule bilaterally. Bilateral kidneys enhance symmetrically. No hydronephrosis. No hydroureter. The urinary bladder is unremarkable. Stomach/Bowel: Stomach is within normal limits. No evidence of bowel wall thickening or dilatation. Appendix appears normal. Vascular/Lymphatic: No abdominal aorta or iliac aneurysm. Mild atherosclerotic plaque of the aorta and its branches. Prominent upper limits of normal left retroperitoneal lymph nodes. No abdominal, pelvic, or inguinal lymphadenopathy. Reproductive: There is a 5.1 x 3.2 cm right ovarian simple cystic lesion. Question multi septated left ovarian cystic lesion measuring up to at least 6 cm. Possible solid components as well (2:74). Other: No intraperitoneal free fluid. No intraperitoneal free gas. No organized fluid collection. Musculoskeletal: No abdominal wall hernia or abnormality. No suspicious lytic or blastic osseous lesions. No acute displaced fracture. Multilevel degenerative changes of the spine. IMPRESSION: 1. Suggestion of a complex left ovarian lesion. Concern for possible malignancy. Recommend pelvic ultrasound. 2. A 5.1 cm right ovarian simple cystic lesion. Recommend pelvic ultrasound given size. 3. Prominent upper limits of normal left retroperitoneal lymph nodes. Recommend attention on follow-up. Electronically Signed   By: Tish Frederickson M.D.   On: 11/01/2022 19:50   DG Chest Port 1 View  Result Date: 11/01/2022 CLINICAL DATA:  Sepsis EXAM: PORTABLE CHEST 1 VIEW COMPARISON:  X-ray 11/15/2021 and CT angiogram FINDINGS: No consolidation, pneumothorax or effusion. No edema. Normal cardiopericardial silhouette. Overlapping cardiac leads. Fixation hardware seen of the lower cervical spine at the edge of the imaging field. IMPRESSION: No acute cardiopulmonary disease. Electronically Signed   By: Karen Kays M.D.   On: 11/01/2022 16:36     Data Reviewed: Relevant notes from primary care and specialist visits, past discharge summaries as  available in EHR, including Care Everywhere. Prior diagnostic testing as pertinent to current admission diagnoses Updated medications and problem lists for reconciliation ED course, including vitals, labs, imaging, treatment and response to treatment Triage notes, nursing and pharmacy notes and ED provider's notes Notable results as noted in HPI   Assessment and Plan: * Sepsis (HCC) Suspect pelvic inflammatory disease, acute Complex cyst left ovary concerning for malignancy with simple cyst right ovary Possible UTI.  No pyelonephritis or obstructing stone on CT Patient has a 2-week history of a yellow vaginal discharge and suprapubic pain Had STD testing at health department on 7/29 but GC chlamydia not yet back.  Wet mount was negative Will get urine GC and chlamydia PCR Urinalysis with possible UTI Continue IV ceftriaxone Doxycycline for possible STD Sepsis fluids GYN consult Transvaginal ultrasound per recommendation of radiology  History of recurrent pulmonary embolism/DVT Patient not currently on systemic anticoagulation. 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 Feliberto Gottron  Advance Care Planning: full  Family Communication: none  Disposition Plan: Back to previous home environment  Severity of Illness: The appropriate patient status for this patient is INPATIENT. Inpatient status is judged to be reasonable and necessary in order to provide the required intensity of service to ensure the patient's safety. The patient's presenting symptoms, physical exam findings, and initial radiographic and laboratory data in the context of their chronic comorbidities is felt to place them at high risk for further  clinical deterioration. Furthermore, it is not anticipated that the patient will be medically stable for discharge from the hospital within 2 midnights of admission.   * I certify that at the point of admission it is my clinical judgment that the patient will require inpatient hospital care spanning beyond 2 midnights from the point of admission due to high intensity of service, high risk for further deterioration and high frequency of surveillance required.*  Author: Andris Baumann, MD 11/01/2022 8:09 PM  For on call review [email or web address].

## 2022-11-01 NOTE — ED Triage Notes (Addendum)
Pt arrives via POV w/ c/o lower abd pain, "excessive" yellow vaginal discharge, and tea colored urine. Pt also endorsing pain in abd when she pees or has BM. Pt states she has been to the health dept and has been to UC, STD checked and checked for a uti, all were negative but she was given prophylactic abx. Pt reports this has been going on for 2.5 weeks, pt states that prior to this she was having her period for 2.5 weeks with "pink" bleeding.

## 2022-11-01 NOTE — Sepsis Progress Note (Signed)
Elink following code sepsis °

## 2022-11-01 NOTE — Consult Note (Signed)
CODE SEPSIS - PHARMACY COMMUNICATION  **Broad-spectrum antimicrobials should be administered within one hour of sepsis diagnosis**  Time Code Sepsis call or page was received: 1641  Antibiotics ordered: Ceftriaxone  Time of first antibiotic administration: 1648  Additional action taken by pharmacy: N/A  If necessary, name of provider/nurse contacted: N/A    Will M. Dareen Piano, PharmD Clinical Pharmacist 11/01/2022 4:56 PM

## 2022-11-01 NOTE — Assessment & Plan Note (Addendum)
Suspect pelvic inflammatory disease, acute Complex cyst left ovary concerning for malignancy with simple cyst right ovary Possible UTI.  No pyelonephritis or obstructing stone on CT Patient has a 2-week history of a yellow vaginal discharge and suprapubic pain Had STD testing at health department on 7/29 but GC chlamydia not yet back.  Wet mount was negative Will get urine GC and chlamydia PCR Transvaginal ultrasound per recommendation of radiology Urinalysis with possible UTI Continue IV ceftriaxone Doxycycline for possible STD Sepsis fluids GYN consult: Discussed with Dr Feliberto Gottron on secure chat who will get his CNM to see her .  He will update tomorrow after TVUS done and he ordered a ca 125

## 2022-11-02 ENCOUNTER — Inpatient Hospital Stay: Payer: 59

## 2022-11-02 DIAGNOSIS — N7093 Salpingitis and oophoritis, unspecified: Secondary | ICD-10-CM | POA: Diagnosis not present

## 2022-11-02 LAB — BASIC METABOLIC PANEL
Anion gap: 11 (ref 5–15)
BUN: 9 mg/dL (ref 6–20)
CO2: 22 mmol/L (ref 22–32)
Calcium: 8.6 mg/dL — ABNORMAL LOW (ref 8.9–10.3)
Chloride: 107 mmol/L (ref 98–111)
Creatinine, Ser: 0.56 mg/dL (ref 0.44–1.00)
GFR, Estimated: >60 mL/min (ref >60)
Glucose, Bld: 101 mg/dL — ABNORMAL HIGH (ref 70–99)
Potassium: 3.8 mmol/L (ref 3.5–5.1)
Sodium: 140 mmol/L (ref 135–145)

## 2022-11-02 LAB — CBC
HCT: 35.1 % — ABNORMAL LOW (ref 36.0–46.0)
Hemoglobin: 11.7 g/dL — ABNORMAL LOW (ref 12.0–15.0)
MCH: 31.5 pg (ref 26.0–34.0)
MCHC: 33.3 g/dL (ref 30.0–36.0)
MCV: 94.4 fL (ref 80.0–100.0)
Platelets: 391 10*3/uL (ref 150–400)
RBC: 3.72 MIL/uL — ABNORMAL LOW (ref 3.87–5.11)
RDW: 14.6 % (ref 11.5–15.5)
WBC: 12.2 10*3/uL — ABNORMAL HIGH (ref 4.0–10.5)
nRBC: 0 % (ref 0.0–0.2)

## 2022-11-02 LAB — CORTISOL-AM, BLOOD: Cortisol - AM: 6.4 ug/dL — ABNORMAL LOW (ref 6.7–22.6)

## 2022-11-02 LAB — PROTIME-INR
INR: 1.2 (ref 0.8–1.2)
Prothrombin Time: 15.2 seconds (ref 11.4–15.2)

## 2022-11-02 LAB — PROCALCITONIN: Procalcitonin: <0.1 ng/mL

## 2022-11-02 LAB — HIV ANTIBODY (ROUTINE TESTING W REFLEX): HIV Screen 4th Generation wRfx: NONREACTIVE

## 2022-11-02 MED ORDER — ENOXAPARIN SODIUM 60 MG/0.6ML IJ SOSY
0.5000 mg/kg | PREFILLED_SYRINGE | INTRAMUSCULAR | Status: DC
  Administered 2022-11-02 – 2022-11-04 (×3): 45 mg via SUBCUTANEOUS
  Filled 2022-11-02 (×3): qty 0.6

## 2022-11-02 MED ORDER — SODIUM CHLORIDE 0.9 % IV SOLN: 1.0000 g | INTRAVENOUS | Status: DC

## 2022-11-02 MED ORDER — SODIUM CHLORIDE 0.9 % IV SOLN
2.0000 g | Freq: Four times a day (QID) | INTRAVENOUS | Status: DC
  Administered 2022-11-02 – 2022-11-04 (×8): 2 g via INTRAVENOUS
  Filled 2022-11-02 (×9): qty 2

## 2022-11-02 MED ORDER — KETOROLAC TROMETHAMINE 30 MG/ML IJ SOLN: 30.0000 mg | Freq: Four times a day (QID) | INTRAMUSCULAR | Status: DC | PRN

## 2022-11-02 MED ORDER — ONDANSETRON HCL 4 MG PO TABS: 4.0000 mg | ORAL_TABLET | Freq: Four times a day (QID) | ORAL | Status: DC | PRN

## 2022-11-02 MED ORDER — SODIUM CHLORIDE 0.9 % IV SOLN: 2.0000 g | INTRAVENOUS | Status: DC

## 2022-11-02 MED ORDER — NICOTINE 21 MG/24HR TD PT24
21.0000 mg | MEDICATED_PATCH | Freq: Every day | TRANSDERMAL | Status: DC
  Filled 2022-11-02 (×2): qty 1

## 2022-11-02 MED ORDER — ONDANSETRON HCL 4 MG/2ML IJ SOLN: 4.0000 mg | Freq: Four times a day (QID) | INTRAMUSCULAR | Status: DC | PRN

## 2022-11-02 MED ORDER — SODIUM CHLORIDE 0.9 % IV SOLN: 1.0000 g | Freq: Two times a day (BID) | INTRAVENOUS | Status: DC

## 2022-11-02 MED ORDER — ACETAMINOPHEN 325 MG PO TABS: 650.0000 mg | ORAL_TABLET | Freq: Four times a day (QID) | ORAL | Status: DC | PRN

## 2022-11-02 MED ORDER — HYDROCODONE-ACETAMINOPHEN 5-325 MG PO TABS: 1.0000 | ORAL_TABLET | ORAL | Status: DC | PRN

## 2022-11-02 MED ORDER — DOXYCYCLINE HYCLATE 100 MG PO TABS
100.0000 mg | ORAL_TABLET | Freq: Two times a day (BID) | ORAL | Status: DC
  Administered 2022-11-02 – 2022-11-04 (×6): 100 mg via ORAL
  Filled 2022-11-02 (×6): qty 1

## 2022-11-02 MED ORDER — ACETAMINOPHEN 650 MG RE SUPP: 650.0000 mg | Freq: Four times a day (QID) | RECTAL | Status: DC | PRN

## 2022-11-02 MED ORDER — SODIUM CHLORIDE 0.9 % IV SOLN
2.0000 g | INTRAVENOUS | Status: DC
  Filled 2022-11-02: qty 20

## 2022-11-02 MED ORDER — METRONIDAZOLE 500 MG/100ML IV SOLN
500.0000 mg | Freq: Two times a day (BID) | INTRAVENOUS | Status: DC
  Administered 2022-11-02 – 2022-11-03 (×4): 500 mg via INTRAVENOUS
  Filled 2022-11-02 (×5): qty 100

## 2022-11-02 MED ORDER — LACTATED RINGERS IV SOLN
150.0000 mL/h | INTRAVENOUS | Status: DC
  Administered 2022-11-02: 150 mL/h via INTRAVENOUS

## 2022-11-02 NOTE — Plan of Care (Signed)
  Problem: Education: Goal: Knowledge of General Education information will improve Description Including pain rating scale, medication(s)/side effects and non-pharmacologic comfort measures Outcome: Progressing   

## 2022-11-02 NOTE — TOC CM/SW Note (Signed)
Transition of Care [facility]) - Inpatient Brief Assessment   Patient Details  Name: Caitlin Wheeler MRN: [id number] Date of Birth: [birth year]  Transition of Care [facility]) CM/SW Contact:    Allena Katz, LCSW Phone Number: 11/02/2022, 11:07 AM   Clinical Narrative:    Transition of Care Asessment: Insurance and Status: Insurance coverage has been reviewed Patient has primary care physician: No (added to AVS)   Prior level of function:: [address] [place] [address] Prior/Current Home Services: No current home services Social Determinants of Health Reivew: SDOH reviewed no interventions necessary Readmission risk has been reviewed: Yes Transition of care needs: no transition of care needs at this time
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
## 2022-11-02 NOTE — Consult Note (Addendum)
Consult History and Physical   SERVICE: Gynecology   Patient Name: Caitlin Wheeler Patient MRN:   [id number]  CC: Abdominal pain  HPI: [name] is a 47 y.o. G1P0010 with abdominal pain.  Code sepsis activated and it is thought to be likely urinary tract infection.  Chest x-ray is negative for pneumonia, abdominal CT pending.  Most current lab results noted below.   Review of Systems: positives in bold GEN:   fevers, chills, weight changes, appetite changes, fatigue, night sweats HEENT:  HA, vision changes, hearing loss, congestion, rhinorrhea, sinus pressure, dysphagia CV:   CP, palpitations PULM:  SOB, cough GI:  abd pain, N/V/D/C GU:  dysuria, urgency, frequency MSK:  arthralgias, myalgias, back pain, swelling SKIN:  rashes, color changes, pallor NEURO:  numbness, weakness, tingling, seizures, dizziness, tremors PSYCH:  depression, anxiety, behavioral problems, confusion  HEME/LYMPH:  easy bruising or bleeding ENDO:  heat/cold intolerance  Past Obstetrical History: OB History     Gravida  1   Para      Term      Preterm      AB  1   Living         SAB      IAB      Ectopic      Multiple      Live Births              Past Gynecologic History: Patient's last menstrual period was 10/14/2022.   Past Medical History: Past Medical History:  Diagnosis Date   Abnormal Pap smear of cervix    DVT (deep vein thrombosis) in pregnancy    Pulmonary embolism [facility])     Past Surgical History:   Past Surgical History:  Procedure Laterality Date   BUNIONECTOMY Bilateral    CHOLECYSTECTOMY     NECK SURGERY      Family History:  family history includes Hypertension in her mother.  Social History:  Social History   Socioeconomic History   Marital status: Single    Spouse name: Not on file   Number of children: Not on file   Years of education: Not on file   Highest education level: Not on file  Occupational History   Not on file   Tobacco Use   Smoking status: Every Day    Current packs/day: 0.25    Average packs/day: 0.3 packs/day for 32.6 years (8.1 ttl pk-yrs)    Types: Cigarettes    Start date: 65   Smokeless tobacco: Never  Vaping Use   Vaping status: Former   Start date: 09/18/2020   Substances: Nicotine, Flavoring  Substance and Sexual Activity   Alcohol use: Yes    Alcohol/week: 4.0 standard drinks of alcohol    Types: 4 Cans of beer per week    Comment: once a week   Drug use: Yes    Types: "Crack" cocaine, Marijuana    Comment: last MJ use 2012   Sexual activity: Yes    Birth control/protection: Pill  Other Topics Concern   Not on file  Social History Narrative   Not on file   Social Determinants of Health   Financial Resource Strain: Not on file  Food Insecurity: No Food Insecurity (11/01/2022)   Hunger Vital Sign    Worried About Running Out of Food in the Last Year: Never true    Ran Out of Food in the Last Year: Never true  Transportation Needs: No Transportation Needs (11/01/2022)   PRAPARE - Transportation  Lack of Transportation (Medical): No    Lack of Transportation (Non-Medical): No  Physical Activity: Not on file  Stress: Not on file  Social Connections: Not on file  Intimate Partner Violence: Not At Risk (11/01/2022)   Humiliation, Afraid, Rape, and Kick questionnaire    Fear of Current or Ex-Partner: No    Emotionally Abused: No    Physically Abused: No    Sexually Abused: No    Home Medications:  Medications reconciled in EPIC  No current facility-administered medications on file prior to encounter.   Current Outpatient Medications on File Prior to Encounter  Medication Sig Dispense Refill   Diclofenac Sodium (DICLO GEL) 1 % KIT Place onto the skin. (Patient not taking: Reported on 09/21/2020)     gabapentin (NEURONTIN) 300 MG capsule Take 900 mg by mouth. (Patient not taking: Reported on 09/21/2020)     gabapentin (NEURONTIN) 600 MG tablet Take 1,800 mg by mouth 3  (three) times daily. (Patient not taking: Reported on 09/21/2020)     nitrofurantoin, macrocrystal-monohydrate, (MACROBID) 100 MG capsule Take 1 capsule (100 mg total) by mouth 2 (two) times daily. 10 capsule 0   norethindrone (MICRONOR) 0.35 MG tablet Take 1 tablet (0.35 mg total) by mouth daily. (Patient not taking: Reported on 10/29/2022) 28 tablet 0   rivaroxaban (XARELTO) 20 MG TABS tablet Take 1 tablet (20 mg total) by mouth daily with supper. (Patient not taking: Reported on 10/29/2022) 30 tablet 1    Allergies:  Allergies  Allergen Reactions   Latex Itching and Rash    Physical Exam:  Temp:  [97.6 F (36.4 C)-101.3 F (38.5 C)] 97.7 F (36.5 C) (08/02 0425) Pulse Rate:  [79-146] 79 (08/02 0425) Resp:  [18-22] 20 (08/02 0425) BP: (100-142)/(51-94) 100/51 (08/02 0425) SpO2:  [95 %-100 %] 97 % (08/02 0425) Weight:  [89.8 kg] 89.8 kg (08/01 1544)   Physical Exam Constitutional:      General: She is not in acute distress. Pulmonary:     Effort: Pulmonary effort is normal.  Abdominal:     General: Abdomen is flat. There is no distension.     Tenderness: There is generalized abdominal tenderness.  Genitourinary:    Labia:        Right: No rash, tenderness, lesion or injury.        Left: No rash, tenderness, lesion or injury.      Vagina: Vaginal discharge present. No tenderness.     Cervix: No cervical motion tenderness or cervical bleeding.     Uterus: Not tender.      Adnexa:        Right: No mass, tenderness or fullness.         Left: No mass, tenderness or fullness.    Skin:    General: Skin is warm and dry.  Neurological:     General: No focal deficit present.     Mental Status: She is alert.  Psychiatric:        Mood and Affect: Mood normal.        Behavior: Behavior normal.       Labs/Studies:   CBC and Coags:  Lab Results  Component Value Date   WBC 12.2 (H) 11/02/2022   NEUTOPHILPCT 89 11/01/2022   EOSPCT 0 11/01/2022   BASOPCT 0 11/01/2022    LYMPHOPCT 7 11/01/2022   HGB 11.7 (L) 11/02/2022   HCT 35.1 (L) 11/02/2022   MCV 94.4 11/02/2022   PLT 391 11/02/2022   INR 1.2 11/02/2022  CMP:  Lab Results  Component Value Date   NA 140 11/02/2022   K 3.8 11/02/2022   CL 107 11/02/2022   CO2 22 11/02/2022   BUN 9 11/02/2022   CREATININE 0.56 11/02/2022   CREATININE 0.77 11/01/2022   CREATININE 1.33 (H) 11/15/2021   PROT 8.7 (H) 11/01/2022   BILITOT 0.5 11/01/2022   ALT 17 11/01/2022   AST 19 11/01/2022   ALKPHOS 81 11/01/2022    Other Imaging: CT ABDOMEN PELVIS W CONTRAST  Result Date: 11/01/2022 CLINICAL DATA:  Sepsis EXAM: CT ABDOMEN AND PELVIS WITH CONTRAST TECHNIQUE: Multidetector CT imaging of the abdomen and pelvis was performed using the standard protocol following bolus administration of intravenous contrast. RADIATION DOSE REDUCTION: This exam was performed according to the departmental dose-optimization program which includes automated exposure control, adjustment of the mA and/or kV according to patient size and/or use of iterative reconstruction technique. CONTRAST:  OMNIPAQUE IOHEXOL 300 MG/ML  SOLN COMPARISON:  None Available. FINDINGS: Lower chest: Couple subpleural triangular pulmonary micronodule within the right lung likely of intrapulmonary lymph nodes - no further follow-up indicated. No acute abnormality. Hepatobiliary: No focal liver abnormality. Status post cholecystectomy. No biliary dilatation. Pancreas: No focal lesion. Normal pancreatic contour. No surrounding inflammatory changes. No main pancreatic ductal dilatation. Spleen: Normal in size without focal abnormality. Adrenals/Urinary Tract: No adrenal nodule bilaterally. Bilateral kidneys enhance symmetrically. No hydronephrosis. No hydroureter. The urinary bladder is unremarkable. Stomach/Bowel: Stomach is within normal limits. No evidence of bowel wall thickening or dilatation. Appendix appears normal. Vascular/Lymphatic: No abdominal aorta or iliac  aneurysm. Mild atherosclerotic plaque of the aorta and its branches. Prominent upper limits of normal left retroperitoneal lymph nodes. No abdominal, pelvic, or inguinal lymphadenopathy. Reproductive: There is a 5.1 x 3.2 cm right ovarian simple cystic lesion. Question multi septated left ovarian cystic lesion measuring up to at least 6 cm. Possible solid components as well (2:74). Other: No intraperitoneal free fluid. No intraperitoneal free gas. No organized fluid collection. Musculoskeletal: No abdominal wall hernia or abnormality. No suspicious lytic or blastic osseous lesions. No acute displaced fracture. Multilevel degenerative changes of the spine. IMPRESSION: 1. Suggestion of a complex left ovarian lesion. Concern for possible malignancy. Recommend pelvic ultrasound. 2. A 5.1 cm right ovarian simple cystic lesion. Recommend pelvic ultrasound given size. 3. Prominent upper limits of normal left retroperitoneal lymph nodes. Recommend attention on follow-up. Electronically Signed   By: Tish Frederickson M.D.   On: 11/01/2022 19:50   DG Chest Port 1 View  Result Date: 11/01/2022 CLINICAL DATA:  Sepsis EXAM: PORTABLE CHEST 1 VIEW COMPARISON:  X-ray 11/15/2021 and CT angiogram FINDINGS: No consolidation, pneumothorax or effusion. No edema. Normal cardiopericardial silhouette. Overlapping cardiac leads. Fixation hardware seen of the lower cervical spine at the edge of the imaging field. IMPRESSION: No acute cardiopulmonary disease. Electronically Signed   By: Karen Kays M.D.   On: 11/01/2022 16:36     Assessment / Plan:   Janaya A Liese is a 47 y.o. G1P0010 who presents with abdominal pain  1. Bimanual exam completed with exam results noted above 2. Dr. Feliberto Gottron to follow up with evaluation  Thank you for the opportunity to be involved with this pt's care.

## 2022-11-02 NOTE — Progress Notes (Signed)
Anticoagulation monitoring(Lovenox):  47 yo female ordered Lovenox 40 mg Q24h    Filed Weights   11/01/22 1544  Weight: 89.8 kg (198 lb)   BMI 31   Lab Results  Component Value Date   CREATININE 0.77 11/01/2022   CREATININE 1.33 (H) 11/15/2021   CREATININE 0.71 02/21/2013   Estimated Creatinine Clearance: 101.1 mL/min (by C-G formula based on SCr of 0.77 mg/dL). Hemoglobin & Hematocrit     Component Value Date/Time   HGB 13.3 11/01/2022 1557   HGB 13.2 02/21/2013 0621   HCT 40.8 11/01/2022 1557   HCT 40.0 02/21/2013 0621     Per Protocol for Patient with estCrcl > 30 ml/min and BMI > 40, will transition to Lovenox 45 mg Q24h.

## 2022-11-02 NOTE — Discharge Instructions (Signed)
Some PCP options in [place] area- not a comprehensive list  [facility]- [phone] [place]- [phone] [facility]- [phone] [facility]- [phone] [facility]- [phone] [place]- [phone]  or [place] [facility]erral Line [phone]  

## 2022-11-02 NOTE — Consult Note (Signed)
Consult History and Physical   SERVICE: Gynecology Unassigned GYN consultGavin Potters  Patient Name: [facility] Patient MRN:   [id number]  CC:1 week abdominal pain and dysuria and dyschezia  HPI: Caitlin Wheeler is a 46 y.o. G1P0010 with a one week h/o worseing lower abdominal pain . Pain urination and pressure with BM . Day of admission septic with elevated WBC , lactic acid and febrile .  CTscan and follow on u/s shows probable 5 cm right TOA and left hydrosalpinx Not sexually active in 10 months.  Pt was started on Rocephin and doxycycline for possible urosepsis . Since tvus flagyl added . Pt feels abit better today . No N/V  Review of Systems: positives in bold GEN:   fevers, chills, weight changes, appetite changes, fatigue, night sweats HEENT:  HA, vision changes, hearing loss, congestion, rhinorrhea, sinus pressure, dysphagia CV:   CP, palpitations PULM:  SOB, cough GI:  +abd pain, N/V/D/C GU:  +dysuria, urgency, frequency, + dyschezia MSK:  arthralgias, myalgias, back pain, swelling SKIN:  rashes, color changes, pallor NEURO:  numbness, weakness, tingling, seizures, dizziness, tremors PSYCH:  depression, anxiety, behavioral problems, confusion  HEME/LYMPH:  easy bruising or bleeding ENDO:  heat/cold intolerance  Past Obstetrical History: OB History     Gravida  1   Para      Term      Preterm      AB  1   Living         SAB      IAB      Ectopic      Multiple      Live Births              Past Gynecologic History: Patient's last menstrual period was 10/14/2022. Menses irregular recently  Past Medical History: Past Medical History:  Diagnosis Date   Abnormal Pap smear of cervix    DVT (deep vein thrombosis) in pregnancy    Pulmonary embolism [facility])     Past Surgical History:   Past Surgical History:  Procedure Laterality Date   BUNIONECTOMY Bilateral    CHOLECYSTECTOMY     NECK SURGERY      Family History:  family history  includes Hypertension in her mother.  Social History:  Social History   Socioeconomic History   Marital status: Single    Spouse name: Not on file   Number of children: Not on file   Years of education: Not on file   Highest education level: Not on file  Occupational History   Not on file  Tobacco Use   Smoking status: Every Day    Current packs/day: 0.25    Average packs/day: 0.3 packs/day for 32.6 years (8.1 ttl pk-yrs)    Types: Cigarettes    Start date: 15   Smokeless tobacco: Never  Vaping Use   Vaping status: Former   Start date: 09/18/2020   Substances: Nicotine, Flavoring  Substance and Sexual Activity   Alcohol use: Yes    Alcohol/week: 4.0 standard drinks of alcohol    Types: 4 Cans of beer per week    Comment: once a week   Drug use: Yes    Types: "Crack" cocaine, Marijuana    Comment: last MJ use 2012   Sexual activity: Yes    Birth control/protection: Pill  Other Topics Concern   Not on file  Social History Narrative   Not on file   Social Determinants of Health   Financial Resource Strain: Not on  file  Food Insecurity: No Food Insecurity (11/01/2022)   Hunger Vital Sign    Worried About Running Out of Food in the Last Year: Never true    Ran Out of Food in the Last Year: Never true  Transportation Needs: No Transportation Needs (11/01/2022)   PRAPARE - Administrator, Civil Service (Medical): No    Lack of Transportation (Non-Medical): No  Physical Activity: Not on file  Stress: Not on file  Social Connections: Not on file  Intimate Partner Violence: Not At Risk (11/01/2022)   Humiliation, Afraid, Rape, and Kick questionnaire    Fear of Current or Ex-Partner: No    Emotionally Abused: No    Physically Abused: No    Sexually Abused: No    Home Medications:  Medications reconciled in EPIC  No current facility-administered medications on file prior to encounter.   Current Outpatient Medications on File Prior to Encounter  Medication  Sig Dispense Refill   Diclofenac Sodium (DICLO GEL) 1 % KIT Place onto the skin. (Patient not taking: Reported on 09/21/2020)     gabapentin (NEURONTIN) 300 MG capsule Take 900 mg by mouth. (Patient not taking: Reported on 09/21/2020)     gabapentin (NEURONTIN) 600 MG tablet Take 1,800 mg by mouth 3 (three) times daily. (Patient not taking: Reported on 09/21/2020)     nitrofurantoin, macrocrystal-monohydrate, (MACROBID) 100 MG capsule Take 1 capsule (100 mg total) by mouth 2 (two) times daily. 10 capsule 0   norethindrone (MICRONOR) 0.35 MG tablet Take 1 tablet (0.35 mg total) by mouth daily. (Patient not taking: Reported on 10/29/2022) 28 tablet 0   rivaroxaban (XARELTO) 20 MG TABS tablet Take 1 tablet (20 mg total) by mouth daily with supper. (Patient not taking: Reported on 10/29/2022) 30 tablet 1    Allergies:  Allergies  Allergen Reactions   Latex Itching and Rash    Physical Exam:  Temp:  [97.3 F (36.3 C)-101.3 F (38.5 C)] 97.3 F (36.3 C) (08/02 0807) Pulse Rate:  [79-146] 80 (08/02 0807) Resp:  [18-22] 18 (08/02 0807) BP: (99-142)/(51-94) 99/60 (08/02 0807) SpO2:  [95 %-100 %] 97 % (08/02 0807) Weight:  [89.8 kg] 89.8 kg (08/01 1544)   General Appearance:  Well developed, well nourished, no acute distress, alert and oriented x3 HEENT:  Normocephalic atraumatic, extraocular movements intact, moist mucous membranes Cardiovascular:  Normal S1/S2, regular rate and rhythm, no murmurs Pulmonary:  clear to auscultation, no wheezes, rales or rhonchi, symmetric air entry, good air exchange Abdomen:  Bowel sounds present, soft, nontender, nondistended, no abnormal masses, no epigastric pain Extremities:  Full range of motion, no pedal edema, 2+ distal pulses, no tenderness Skin:  normal coloration and turgor, no rashes, no suspicious skin lesions noted  Neurologic:  Cranial nerves 2-12 grossly intact, normal muscle tone, strength 5/5 all four extremities Psychiatric:  Normal mood and  affect, appropriate, no AH/VH Pelvic:  deferred See CNM [place] note last pm   Labs/Studies:   CBC and Coags:  Lab Results  Component Value Date   WBC 12.2 (H) 11/02/2022   NEUTOPHILPCT 89 11/01/2022   EOSPCT 0 11/01/2022   BASOPCT 0 11/01/2022   LYMPHOPCT 7 11/01/2022   HGB 11.7 (L) 11/02/2022   HCT 35.1 (L) 11/02/2022   MCV 94.4 11/02/2022   PLT 391 11/02/2022   INR 1.2 11/02/2022   CMP:  Lab Results  Component Value Date   NA 140 11/02/2022   K 3.8 11/02/2022   CL 107 11/02/2022  CO2 22 11/02/2022   BUN 9 11/02/2022   CREATININE 0.56 11/02/2022   CREATININE 0.77 11/01/2022   CREATININE 1.33 (H) 11/15/2021   PROT 8.7 (H) 11/01/2022   BILITOT 0.5 11/01/2022   ALT 17 11/01/2022   AST 19 11/01/2022   ALKPHOS 81 11/01/2022    Other Imaging: US PELVIC COMPLETE W TRANSVAGINAL AND TORSION R/O  Result Date: 11/02/2022 CLINICAL DATA:  Evaluate adnexal masses. EXAM: TRANSABDOMINAL AND TRANSVAGINAL ULTRASOUND OF PELVIS DOPPLER ULTRASOUND OF OVARIES TECHNIQUE: Both transabdominal and transvaginal ultrasound examinations of the pelvis were performed. Transabdominal technique was performed for global imaging of the pelvis including uterus, ovaries, adnexal regions, and pelvic cul-de-sac. It was necessary to proceed with endovaginal exam following the transabdominal exam to visualize the ovaries. Color and duplex Doppler ultrasound was utilized to evaluate blood flow to the ovaries. COMPARISON:  CT AP from 11/01/2022 FINDINGS: Uterus Measurements: 8.1 x 3.4 x 5.9 cm = volume: 85.2 mL. No fibroids or other mass visualized. Endometrium Thickness: 6 mm.  No focal abnormality visualized. Right ovary Measurements: 6.3 x 4.8 x 4.4 cm = volume: 70.2 mL. Complex cystic mass within the right ovary measures 4.6 x 3.4 x 3.7 cm. This contains diffuse internal echoes without internal blood flow. Left ovary Measurements: 8.8 x 4.9 x 3.8 cm. = volume: 86.2 mL. Elongated, tubular anechoic structure within  the left adnexa is identified. Pulsed Doppler evaluation of both ovaries demonstrates normal low-resistance arterial and venous waveforms. Other findings No abnormal free fluid. IMPRESSION: 1. Complex cystic mass within the right ovary measuring up to 4.6 cm. In the setting of suspected infection cannot exclude tubo-ovarian abscess. Differential considerations include hemorrhagic cyst or endometrioma. 2. Elongated, tubular anechoic structure within the left adnexa is favored to represent hydrosalpinx. 3. No evidence of ovarian torsion. Electronically Signed   By: Signa Kell M.D.   On: 11/02/2022 06:50   CT ABDOMEN PELVIS W CONTRAST  Result Date: 11/01/2022 CLINICAL DATA:  Sepsis EXAM: CT ABDOMEN AND PELVIS WITH CONTRAST TECHNIQUE: Multidetector CT imaging of the abdomen and pelvis was performed using the standard protocol following bolus administration of intravenous contrast. RADIATION DOSE REDUCTION: This exam was performed according to the departmental dose-optimization program which includes automated exposure control, adjustment of the mA and/or kV according to patient size and/or use of iterative reconstruction technique. CONTRAST:  OMNIPAQUE IOHEXOL 300 MG/ML  SOLN COMPARISON:  None Available. FINDINGS: Lower chest: Couple subpleural triangular pulmonary micronodule within the right lung likely of intrapulmonary lymph nodes - no further follow-up indicated. No acute abnormality. Hepatobiliary: No focal liver abnormality. Status post cholecystectomy. No biliary dilatation. Pancreas: No focal lesion. Normal pancreatic contour. No surrounding inflammatory changes. No main pancreatic ductal dilatation. Spleen: Normal in size without focal abnormality. Adrenals/Urinary Tract: No adrenal nodule bilaterally. Bilateral kidneys enhance symmetrically. No hydronephrosis. No hydroureter. The urinary bladder is unremarkable. Stomach/Bowel: Stomach is within normal limits. No evidence of bowel wall thickening  or dilatation. Appendix appears normal. Vascular/Lymphatic: No abdominal aorta or iliac aneurysm. Mild atherosclerotic plaque of the aorta and its branches. Prominent upper limits of normal left retroperitoneal lymph nodes. No abdominal, pelvic, or inguinal lymphadenopathy. Reproductive: There is a 5.1 x 3.2 cm right ovarian simple cystic lesion. Question multi septated left ovarian cystic lesion measuring up to at least 6 cm. Possible solid components as well (2:74). Other: No intraperitoneal free fluid. No intraperitoneal free gas. No organized fluid collection. Musculoskeletal: No abdominal wall hernia or abnormality. No suspicious lytic or blastic osseous lesions. No  acute displaced fracture. Multilevel degenerative changes of the spine. IMPRESSION: 1. Suggestion of a complex left ovarian lesion. Concern for possible malignancy. Recommend pelvic ultrasound. 2. A 5.1 cm right ovarian simple cystic lesion. Recommend pelvic ultrasound given size. 3. Prominent upper limits of normal left retroperitoneal lymph nodes. Recommend attention on follow-up. Electronically Signed   By: Tish Frederickson M.D.   On: 11/01/2022 19:50   DG Chest Port 1 View  Result Date: 11/01/2022 CLINICAL DATA:  Sepsis EXAM: PORTABLE CHEST 1 VIEW COMPARISON:  X-ray 11/15/2021 and CT angiogram FINDINGS: No consolidation, pneumothorax or effusion. No edema. Normal cardiopericardial silhouette. Overlapping cardiac leads. Fixation hardware seen of the lower cervical spine at the edge of the imaging field. IMPRESSION: No acute cardiopulmonary disease. Electronically Signed   By: Karen Kays M.D.   On: 11/01/2022 16:36     Assessment / Plan:   Caitlin Wheeler is a 47 y.o. G1P0010 who presents with abdominal pain   1. RIght TOA , left hydrosalpinx I would change Rocephin to Cefoxitin 2gm q6 hr for better gram + coverage . Continue Doxycycline 100 mg bid and Flagyl 500 mg tid. AFter 48 hours afebrile and if patient clinically is improving  she can be d/c home on Po Doxycycline 100 mg bid for additional 12 days  She can follow up with me at [facility] the week of 11/12/22.   Thank you for the opportunity to be involved with this pt's care.

## 2022-11-03 DIAGNOSIS — N7011 Chronic salpingitis: Secondary | ICD-10-CM

## 2022-11-03 DIAGNOSIS — N7093 Salpingitis and oophoritis, unspecified: Secondary | ICD-10-CM | POA: Diagnosis not present

## 2022-11-03 MED ORDER — SODIUM CHLORIDE 0.9 % IV SOLN: INTRAVENOUS | Status: DC | PRN

## 2022-11-03 NOTE — Progress Notes (Signed)
Hospital Day # 1, currently admitted for sepsis, possible urosepsis vs TOA as source of infection.  Patient is a 47 y.o. G1P0010 with a one week h/o worseing lower abdominal pain. Pain urination and pressure with BM .    Subjective: Patient denies major complaints. Denies fevers/chills. Still notes some abdominal pain but has improved.  Tolerating regular diet.   Objective: Temp:  [97.6 F (36.4 C)-98.8 F (37.1 C)] 97.6 F (36.4 C) (08/03 0803) Pulse Rate:  [75-84] 78 (08/03 0803) Resp:  [16] 16 (08/03 0803) BP: (93-108)/(73-82) 108/73 (08/03 0803) SpO2:  [97 %-99 %] 97 % (08/03 0803)  Physical Exam:  General: alert and no distress  Lungs: clear to auscultation bilaterally Heart: regular rate and rhythm, S1, S2 normal, no murmur, click, rub or gallop Abdomen: soft, non-tender; bowel sounds normal; no masses,  no organomegaly Pelvis: Deferred Extremities: DVT Evaluation: No evidence of DVT seen on physical exam. Negative Homan's sign. No cords or calf tenderness.  Labs:     Latest Ref Rng & Units 11/03/2022    6:45 AM 11/02/2022    4:01 AM 11/01/2022    3:57 PM  CBC  WBC 4.0 - 10.5 K/uL 9.5  12.2  21.7   Hemoglobin 12.0 - 15.0 g/dL 16.1  09.6  04.5   Hematocrit 36.0 - 46.0 % 36.4  35.1  40.8   Platelets 150 - 400 K/uL 399  391  482        Latest Ref Rng & Units 11/02/2022    4:01 AM 11/01/2022    3:57 PM 11/15/2021    2:57 AM  CMP  Glucose 70 - 99 mg/dL 409  811  914   BUN 6 - 20 mg/dL 9  11  10    Creatinine 0.44 - 1.00 mg/dL 7.82  9.56  2.13   Sodium 135 - 145 mmol/L 140  136  141   Potassium 3.5 - 5.1 mmol/L 3.8  3.6  4.3   Chloride 98 - 111 mmol/L 107  104  111   CO2 22 - 32 mmol/L 22  17  16    Calcium 8.9 - 10.3 mg/dL 8.6  9.3  8.4   Total Protein 6.5 - 8.1 g/dL  8.7  7.5   Total Bilirubin 0.3 - 1.2 mg/dL  0.5  0.5   Alkaline Phos 38 - 126 U/L  81  103   AST 15 - 41 U/L  19  282   ALT 0 - 44 U/L  17  243    Lactic Acid, Venous    Component Value Date/Time    LATICACIDVEN 1.1 11/01/2022 1830    Lab Results  Component Value Date   COLORU other (A) 10/29/2022   CLARITYU cloudy (A) 10/29/2022   GLUCOSEUR negative 10/29/2022   BILIRUBINUR NEGATIVE 11/01/2022   SPECGRAV >=1.030 (A) 10/29/2022   RBCUR negative 10/29/2022   PHUR 6.0 10/29/2022   PROTEINUR 100 (A) 11/01/2022   UROBILINOGEN 1.0 10/29/2022   LEUKOCYTESUR LARGE (A) 11/01/2022    Blood cultures negative x 2 days.   Imaging:  US PELVIC COMPLETE W TRANSVAGINAL AND TORSION R/O CLINICAL DATA:  Evaluate adnexal masses.  EXAM: TRANSABDOMINAL AND TRANSVAGINAL ULTRASOUND OF PELVIS  DOPPLER ULTRASOUND OF OVARIES  TECHNIQUE: Both transabdominal and transvaginal ultrasound examinations of the pelvis were performed. Transabdominal technique was performed for global imaging of the pelvis including uterus, ovaries, adnexal regions, and pelvic cul-de-sac.  It was necessary to proceed with endovaginal exam following the transabdominal exam to visualize the  ovaries. Color and duplex Doppler ultrasound was utilized to evaluate blood flow to the ovaries.  COMPARISON:  CT AP from 11/01/2022  FINDINGS: Uterus  Measurements: 8.1 x 3.4 x 5.9 cm = volume: 85.2 mL. No fibroids or other mass visualized.  Endometrium  Thickness: 6 mm.  No focal abnormality visualized.  Right ovary  Measurements: 6.3 x 4.8 x 4.4 cm = volume: 70.2 mL. Complex cystic mass within the right ovary measures 4.6 x 3.4 x 3.7 cm. This contains diffuse internal echoes without internal blood flow.  Left ovary  Measurements: 8.8 x 4.9 x 3.8 cm. = volume: 86.2 mL. Elongated, tubular anechoic structure within the left adnexa is identified.  Pulsed Doppler evaluation of both ovaries demonstrates normal low-resistance arterial and venous waveforms.  Other findings  No abnormal free fluid.  IMPRESSION: 1. Complex cystic mass within the right ovary measuring up to 4.6 cm. In the setting of suspected  infection cannot exclude tubo-ovarian abscess. Differential considerations include hemorrhagic cyst or endometrioma. 2. Elongated, tubular anechoic structure within the left adnexa is favored to represent hydrosalpinx. 3. No evidence of ovarian torsion.  Electronically Signed   By: Signa Kell M.D.   On: 11/02/2022 06:50    Assessment/Plan: Right TOA, Left hydrosalpinx  Regular diet as tolerated Continue ABX therapy (Cefoxitin, Doxycline, and Flagyl) for total of 48 hours, then transition to PO.  Plan for discharge tomorrow     Hildred Laser, MD [facility]
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
## 2022-11-03 NOTE — Plan of Care (Signed)
  Problem: Education: Goal: Knowledge of General Education information will improve Description Including pain rating scale, medication(s)/side effects and non-pharmacologic comfort measures Outcome: Progressing   

## 2022-11-04 DIAGNOSIS — N7093 Salpingitis and oophoritis, unspecified: Secondary | ICD-10-CM | POA: Diagnosis not present

## 2022-11-04 MED ORDER — CEFDINIR 300 MG PO CAPS: 300.0000 mg | ORAL_CAPSULE | Freq: Two times a day (BID) | ORAL | 0 refills | Status: AC

## 2022-11-04 MED ORDER — DOXYCYCLINE HYCLATE 100 MG PO TABS: 100.0000 mg | ORAL_TABLET | Freq: Two times a day (BID) | ORAL | 0 refills | Status: AC

## 2022-11-04 MED ORDER — CEFDINIR 300 MG PO CAPS
300.0000 mg | ORAL_CAPSULE | Freq: Once | ORAL | Status: AC
  Administered 2022-11-04: 300 mg via ORAL
  Filled 2022-11-04: qty 1

## 2022-11-04 MED ORDER — DOXYCYCLINE HYCLATE 100 MG PO TABS: 100.0000 mg | ORAL_TABLET | Freq: Two times a day (BID) | ORAL | 0 refills | Status: DC

## 2022-11-04 MED ORDER — CEFDINIR 300 MG PO CAPS: 300.0000 mg | ORAL_CAPSULE | Freq: Two times a day (BID) | ORAL | 0 refills | Status: DC

## 2022-11-04 NOTE — Progress Notes (Signed)
Patient discharged home, all belongings sent with patient. All discharge education provided and questions answered.  IV removed
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
## 2022-11-15 ENCOUNTER — Telehealth: Payer: Self-pay | Admitting: Obstetrics and Gynecology

## 2022-11-15 NOTE — Telephone Encounter (Signed)
I contacted the patient via phone x2. No answer. I left voicemail for the patient to log into mychart or contact our of due to scheduling change for Tuesday, 8/20 with Dr. Valentino Saxon is out of office. I have rescheduled the patient to Thursday, 8/29 at 9:35 am with Dr Valentino Saxon if the patient is available. Ok per [facility].

## 2022-11-20 ENCOUNTER — Ambulatory Visit: Payer: 59 | Admitting: Obstetrics and Gynecology

## 2022-11-20 NOTE — Telephone Encounter (Signed)
I contacted the patient via phone. No answer, I left message to follow up on rescheduling the appointment from 8/20. I asked the patient to contact our office if this date/ time doesn't work for her scheduling needs.

## 2022-11-28 NOTE — Progress Notes (Deleted)
GYNECOLOGY PROGRESS NOTE  Subjective:    Patient ID: Caitlin Wheeler, female    DOB: [birth year], 47 y.o.   MRN: [id number]  HPI  Patient is a 47 y.o. G66P0010 female who presents for follow up from ED. She was admitted on 08/01 and discharged on 08/04. She presented to ED for lower abdominal discomfort, dysuria for about 2 weeks. During her evaluation a CT scan an a ultrasound was done which revealed several things. CT scan revealed: There is a 5.1 x 3.2 cm right ovarian simple cystic lesion. Question multi septated left ovarian cystic lesion measuring up to at least 6 cm. Possible solid components as well (2:74).    {Common ambulatory SmartLinks:19316}  Review of Systems {ros; complete:30496}   Objective:   There were no vitals taken for this visit. There is no height or weight on file to calculate BMI. General appearance: {general exam:16600} Abdomen: {abdominal exam:16834} Pelvic: {pelvic exam:16852::"cervix normal in appearance","external genitalia normal","no adnexal masses or tenderness","no cervical motion tenderness","rectovaginal septum normal","uterus normal size, shape, and consistency","vagina normal without discharge"} Extremities: {extremity exam:5109} Neurologic: {neuro exam:17854}  Ultrasound  CLINICAL DATA:  Evaluate adnexal masses.   EXAM: TRANSABDOMINAL AND TRANSVAGINAL ULTRASOUND OF PELVIS   DOPPLER ULTRASOUND OF OVARIES   TECHNIQUE: Both transabdominal and transvaginal ultrasound examinations of the pelvis were performed. Transabdominal technique was performed for global imaging of the pelvis including uterus, ovaries, adnexal regions, and pelvic cul-de-sac.   It was necessary to proceed with endovaginal exam following the transabdominal exam to visualize the ovaries. Color and duplex Doppler ultrasound was utilized to evaluate blood flow to the ovaries.   COMPARISON:  CT AP from 11/01/2022   FINDINGS: Uterus   Measurements: 8.1 x 3.4 x 5.9 cm =  volume: 85.2 mL. No fibroids or other mass visualized.   Endometrium   Thickness: 6 mm.  No focal abnormality visualized.   Right ovary   Measurements: 6.3 x 4.8 x 4.4 cm = volume: 70.2 mL. Complex cystic mass within the right ovary measures 4.6 x 3.4 x 3.7 cm. This contains diffuse internal echoes without internal blood flow.   Left ovary   Measurements: 8.8 x 4.9 x 3.8 cm. = volume: 86.2 mL. Elongated, tubular anechoic structure within the left adnexa is identified.   Pulsed Doppler evaluation of both ovaries demonstrates normal low-resistance arterial and venous waveforms.   Other findings   No abnormal free fluid.   IMPRESSION: 1. Complex cystic mass within the right ovary measuring up to 4.6 cm. In the setting of suspected infection cannot exclude tubo-ovarian abscess. Differential considerations include hemorrhagic cyst or endometrioma. 2. Elongated, tubular anechoic structure within the left adnexa is favored to represent hydrosalpinx. 3. No evidence of ovarian torsion.  Assessment:   No diagnosis found.   Plan:   There are no diagnoses linked to this encounter.    Hildred Laser, MD [facility]

## 2022-11-29 ENCOUNTER — Ambulatory Visit: Payer: 59 | Admitting: Obstetrics and Gynecology
